# Patient Record
Sex: Female | Born: 1985
Health system: Southern US, Community
[De-identification: ages and names within clinical notes are randomized; demographics above are authoritative.]

## PROBLEM LIST (undated history)

## (undated) ENCOUNTER — Inpatient Hospital Stay: Payer: Self-pay

## (undated) DIAGNOSIS — Z8742 Personal history of other diseases of the female genital tract: Secondary | ICD-10-CM

## (undated) DIAGNOSIS — T7840XA Allergy, unspecified, initial encounter: Secondary | ICD-10-CM

## (undated) DIAGNOSIS — Z87442 Personal history of urinary calculi: Secondary | ICD-10-CM

## (undated) DIAGNOSIS — N946 Dysmenorrhea, unspecified: Secondary | ICD-10-CM

## (undated) DIAGNOSIS — K219 Gastro-esophageal reflux disease without esophagitis: Secondary | ICD-10-CM

## (undated) HISTORY — DX: Gastro-esophageal reflux disease without esophagitis: K21.9

## (undated) HISTORY — DX: Personal history of urinary calculi: Z87.442

## (undated) HISTORY — DX: Dysmenorrhea, unspecified: N94.6

## (undated) HISTORY — DX: Allergy, unspecified, initial encounter: T78.40XA

## (undated) HISTORY — DX: Personal history of other diseases of the female genital tract: Z87.42

## (undated) HISTORY — PX: NO PAST SURGERIES: SHX2092

---

## 2009-11-02 ENCOUNTER — Emergency Department: Payer: Self-pay | Admitting: Emergency Medicine

## 2011-07-11 ENCOUNTER — Ambulatory Visit: Payer: Self-pay | Admitting: General Practice

## 2012-07-17 ENCOUNTER — Ambulatory Visit: Payer: Self-pay | Admitting: General Practice

## 2013-11-19 ENCOUNTER — Encounter
Admit: 2013-11-19 | Disposition: A | Discharge status: Home / Self Care | Payer: Self-pay | Admitting: Obstetrics and Gynecology

## 2014-08-20 LAB — OB RESULTS CONSOLE ABO/RH: RH Type: POSITIVE

## 2014-08-20 LAB — OB RESULTS CONSOLE VARICELLA ZOSTER ANTIBODY, IGG: Varicella: IMMUNE

## 2014-08-20 LAB — OB RESULTS CONSOLE ANTIBODY SCREEN: ANTIBODY SCREEN: NEGATIVE

## 2014-08-20 LAB — OB RESULTS CONSOLE GC/CHLAMYDIA
CHLAMYDIA, DNA PROBE: NEGATIVE
GC PROBE AMP, GENITAL: NEGATIVE

## 2014-08-20 LAB — OB RESULTS CONSOLE HGB/HCT, BLOOD
HEMATOCRIT: 34 %
HEMOGLOBIN: 11.3 g/dL

## 2014-08-20 LAB — OB RESULTS CONSOLE RUBELLA ANTIBODY, IGM: Rubella: IMMUNE

## 2014-08-20 LAB — OB RESULTS CONSOLE HIV ANTIBODY (ROUTINE TESTING): HIV: NONREACTIVE

## 2014-08-20 LAB — OB RESULTS CONSOLE PLATELET COUNT: Platelets: 447 10*3/uL

## 2014-08-20 LAB — OB RESULTS CONSOLE RPR: RPR: NONREACTIVE

## 2014-08-20 LAB — OB RESULTS CONSOLE HEPATITIS B SURFACE ANTIGEN: Hepatitis B Surface Ag: NEGATIVE

## 2014-11-14 NOTE — L&D Delivery Note (Signed)
Delivery Note At 1:52 PM a viable female was delivered via Vaginal, Spontaneous Delivery (Presentation: cephalic; LOA ).  APGAR: 9, 9; weight 7 lb 5.8 oz (3340 g).   Placenta status: Intact, Spontaneous.  Cord: 3 vessels with the following complications: None.  Cord pH: not collected  Anesthesia: None  Episiotomy: None Lacerations:  1st degree perineal, hemostatic Suture Repair: none Est. Blood Loss (mL):  300 mL  Mom to postpartum.  Baby to Couplet care / Skin to Skin.  Samantha Blackwell 04/26/2015, 9:11 PM

## 2014-12-10 DIAGNOSIS — K219 Gastro-esophageal reflux disease without esophagitis: Secondary | ICD-10-CM | POA: Insufficient documentation

## 2014-12-10 DIAGNOSIS — R002 Palpitations: Secondary | ICD-10-CM | POA: Insufficient documentation

## 2015-04-16 ENCOUNTER — Ambulatory Visit (INDEPENDENT_AMBULATORY_CARE_PROVIDER_SITE_OTHER): Payer: 59 | Admitting: Obstetrics and Gynecology

## 2015-04-16 ENCOUNTER — Encounter: Payer: Self-pay | Admitting: Obstetrics and Gynecology

## 2015-04-16 ENCOUNTER — Telehealth: Payer: Self-pay | Admitting: Obstetrics and Gynecology

## 2015-04-16 VITALS — BP 108/71 | HR 82 | Ht 63.0 in | Wt 198.6 lb

## 2015-04-16 DIAGNOSIS — O1203 Gestational edema, third trimester: Secondary | ICD-10-CM

## 2015-04-16 DIAGNOSIS — Z3483 Encounter for supervision of other normal pregnancy, third trimester: Secondary | ICD-10-CM

## 2015-04-16 DIAGNOSIS — K5900 Constipation, unspecified: Secondary | ICD-10-CM | POA: Insufficient documentation

## 2015-04-16 DIAGNOSIS — O1200 Gestational edema, unspecified trimester: Secondary | ICD-10-CM | POA: Insufficient documentation

## 2015-04-16 DIAGNOSIS — Z3493 Encounter for supervision of normal pregnancy, unspecified, third trimester: Secondary | ICD-10-CM

## 2015-04-16 DIAGNOSIS — O99619 Diseases of the digestive system complicating pregnancy, unspecified trimester: Secondary | ICD-10-CM

## 2015-04-16 LAB — POCT URINALYSIS DIPSTICK
Bilirubin, UA: NEGATIVE
GLUCOSE UA: NEGATIVE
Ketones, UA: NEGATIVE
LEUKOCYTES UA: NEGATIVE
NITRITE UA: NEGATIVE
PH UA: 6
PROTEIN UA: NEGATIVE
Spec Grav, UA: <=1.005
Urobilinogen, UA: 0.2

## 2015-04-16 NOTE — Progress Notes (Signed)
ROB: Patient c/o possible LOF today.  Also notes having back contractions and pelvic cramping.  Exam with nitrizine test negative, no pooling noted on speculum exam.  Small amount of thin white discharge present, no odor.  Labor precautions given.  RTC in 1 week.  Can strip membranes if undelivered at that time. RTC in 1 week.

## 2015-04-16 NOTE — Telephone Encounter (Signed)
Called pt she states that this morning she noticed a "milky" like fluid leaking from her vagina. Pt states that this fluid ceased when she got in the shower. Pt denies regular contractions and vaginal bleeding. Advised pt to place a panty liner and monitor the amount of fluid throughout the rest of the morning. Advised pt to come in to the office if contractions became regular occuring every 5-10 minutes x 1 hour or if she observed any clear fluid coming from the vagina. Pt gave verbal understanding. Pt to be seen at previously scheduled appt at 3pm today.

## 2015-04-16 NOTE — Patient Instructions (Signed)
Labor and fetal movement precautions reviewed. RTC in 2 weeks.

## 2015-04-16 NOTE — Telephone Encounter (Signed)
Samantha Blackwell called and she has a milky discharge this morning. Pt stated she had an appt this afternoon @ 3 pm but thought she should call.

## 2015-04-24 ENCOUNTER — Ambulatory Visit (INDEPENDENT_AMBULATORY_CARE_PROVIDER_SITE_OTHER): Payer: 59 | Admitting: Obstetrics and Gynecology

## 2015-04-24 VITALS — BP 110/72 | HR 75 | Wt 199.9 lb

## 2015-04-24 DIAGNOSIS — Z3403 Encounter for supervision of normal first pregnancy, third trimester: Secondary | ICD-10-CM

## 2015-04-24 LAB — POCT URINALYSIS DIPSTICK
Bilirubin, UA: NEGATIVE
Blood, UA: NEGATIVE
Glucose, UA: NEGATIVE
KETONES UA: NEGATIVE
NITRITE UA: NEGATIVE
Protein, UA: NEGATIVE
SPEC GRAV UA: 1.025
Urobilinogen, UA: 0.2
pH, UA: 6.5

## 2015-04-24 NOTE — Patient Instructions (Signed)
Labor precautions reviewed.  Call office or follow up at Labor and Delivery for the following: 1) Contractions less than 10 min apart for longer than 1 hour 2) Rupture of membranes 3) Vaginal bleeding requiring a pad 4) Decreased fetal movement  RTC in 4 days for NST RTC in 1 week for routine OB visit.

## 2015-04-24 NOTE — Progress Notes (Signed)
ROB: Patient reports complaints of lower back aching. Occurs mostly at night, wakes her up out of her sleep.  Likely back labor.  Membrane stripping performed today.  Labor precautions given.  RTC next week for NST (for postdates) and ROB visit.

## 2015-04-25 ENCOUNTER — Encounter: Payer: Self-pay | Admitting: Advanced Practice Midwife

## 2015-04-25 ENCOUNTER — Observation Stay
Admission: EM | Admit: 2015-04-25 | Discharge: 2015-04-25 | Disposition: A | Discharge status: Home / Self Care | Payer: 59 | Source: Home / Self Care | Admitting: Obstetrics and Gynecology

## 2015-04-25 DIAGNOSIS — O26899 Other specified pregnancy related conditions, unspecified trimester: Secondary | ICD-10-CM

## 2015-04-25 DIAGNOSIS — Z79899 Other long term (current) drug therapy: Secondary | ICD-10-CM

## 2015-04-25 DIAGNOSIS — K219 Gastro-esophageal reflux disease without esophagitis: Secondary | ICD-10-CM | POA: Diagnosis present

## 2015-04-25 DIAGNOSIS — Z880 Allergy status to penicillin: Secondary | ICD-10-CM

## 2015-04-25 DIAGNOSIS — K5900 Constipation, unspecified: Secondary | ICD-10-CM

## 2015-04-25 DIAGNOSIS — R109 Unspecified abdominal pain: Secondary | ICD-10-CM

## 2015-04-25 DIAGNOSIS — O9962 Diseases of the digestive system complicating childbirth: Principal | ICD-10-CM | POA: Diagnosis present

## 2015-04-25 DIAGNOSIS — Z3A40 40 weeks gestation of pregnancy: Secondary | ICD-10-CM | POA: Diagnosis present

## 2015-04-25 DIAGNOSIS — O99619 Diseases of the digestive system complicating pregnancy, unspecified trimester: Principal | ICD-10-CM

## 2015-04-25 DIAGNOSIS — M7989 Other specified soft tissue disorders: Secondary | ICD-10-CM | POA: Diagnosis present

## 2015-04-25 NOTE — Discharge Summary (Signed)
Per MD order, pt discharged home, ambulatory in stable condition. Accompanied by FOB. Discharge instructions reviewed. Pt and FOB verbalized understanding of instructions.

## 2015-04-25 NOTE — OB Triage Note (Signed)
Pt arrived via wheelchair, Accompanied by FOB Minerva Areola

## 2015-04-26 ENCOUNTER — Encounter: Payer: Self-pay | Admitting: Anesthesiology

## 2015-04-26 ENCOUNTER — Observation Stay
Admission: EM | Admit: 2015-04-26 | Discharge: 2015-04-26 | Disposition: A | Discharge status: Home / Self Care | Payer: 59 | Source: Home / Self Care | Admitting: Obstetrics and Gynecology

## 2015-04-26 ENCOUNTER — Inpatient Hospital Stay
Admission: EM | Admit: 2015-04-26 | Discharge: 2015-04-28 | DRG: 775 | Disposition: A | Discharge status: Home / Self Care | Payer: 59 | Attending: Obstetrics and Gynecology | Admitting: Obstetrics and Gynecology

## 2015-04-26 DIAGNOSIS — Z3490 Encounter for supervision of normal pregnancy, unspecified, unspecified trimester: Secondary | ICD-10-CM

## 2015-04-26 DIAGNOSIS — Z880 Allergy status to penicillin: Secondary | ICD-10-CM | POA: Diagnosis not present

## 2015-04-26 DIAGNOSIS — Z79899 Other long term (current) drug therapy: Secondary | ICD-10-CM | POA: Diagnosis not present

## 2015-04-26 DIAGNOSIS — O9962 Diseases of the digestive system complicating childbirth: Secondary | ICD-10-CM | POA: Diagnosis present

## 2015-04-26 DIAGNOSIS — Z3A40 40 weeks gestation of pregnancy: Secondary | ICD-10-CM | POA: Diagnosis present

## 2015-04-26 DIAGNOSIS — M7989 Other specified soft tissue disorders: Secondary | ICD-10-CM | POA: Diagnosis present

## 2015-04-26 DIAGNOSIS — K5900 Constipation, unspecified: Secondary | ICD-10-CM | POA: Diagnosis present

## 2015-04-26 DIAGNOSIS — K219 Gastro-esophageal reflux disease without esophagitis: Secondary | ICD-10-CM | POA: Diagnosis present

## 2015-04-26 LAB — CBC
HEMATOCRIT: 35.8 % (ref 35.0–47.0)
Hemoglobin: 12 g/dL (ref 12.0–16.0)
MCH: 29.2 pg (ref 26.0–34.0)
MCHC: 33.5 g/dL (ref 32.0–36.0)
MCV: 87.2 fL (ref 80.0–100.0)
PLATELETS: 289 10*3/uL (ref 150–440)
RBC: 4.11 MIL/uL (ref 3.80–5.20)
RDW: 13.4 % (ref 11.5–14.5)
WBC: 19.2 10*3/uL — ABNORMAL HIGH (ref 3.6–11.0)

## 2015-04-26 LAB — TYPE AND SCREEN
ABO/RH(D): A POS
ANTIBODY SCREEN: NEGATIVE

## 2015-04-26 LAB — ABO/RH: ABO/RH(D): A POS

## 2015-04-26 MED ORDER — LACTATED RINGERS IV SOLN: INTRAVENOUS | Status: DC

## 2015-04-26 MED ORDER — HYDROCODONE-ACETAMINOPHEN 5-325 MG PO TABS
1.0000 | ORAL_TABLET | ORAL | Status: DC | PRN
Start: 2015-04-26 — End: 2015-04-28
  Administered 2015-04-26 – 2015-04-27 (×2): 1 via ORAL
  Filled 2015-04-26 (×3): qty 1

## 2015-04-26 MED ORDER — ZOLPIDEM TARTRATE 5 MG PO TABS: 5.0000 mg | ORAL_TABLET | Freq: Every evening | ORAL | Status: DC | PRN

## 2015-04-26 MED ORDER — BENZOCAINE-MENTHOL 20-0.5 % EX AERO
1.0000 "application " | INHALATION_SPRAY | CUTANEOUS | Status: DC | PRN
  Administered 2015-04-28: 1 via TOPICAL
  Filled 2015-04-26 (×2): qty 56

## 2015-04-26 MED ORDER — ONDANSETRON HCL 4 MG/2ML IJ SOLN: 4.0000 mg | INTRAMUSCULAR | Status: DC | PRN

## 2015-04-26 MED ORDER — DIBUCAINE 1 % RE OINT: 1.0000 "application " | TOPICAL_OINTMENT | RECTAL | Status: DC | PRN

## 2015-04-26 MED ORDER — ONDANSETRON HCL 4 MG PO TABS: 4.0000 mg | ORAL_TABLET | ORAL | Status: DC | PRN

## 2015-04-26 MED ORDER — HYDROCODONE-ACETAMINOPHEN 5-325 MG PO TABS
ORAL_TABLET | ORAL | Status: AC
  Administered 2015-04-26: 1
  Filled 2015-04-26: qty 1

## 2015-04-26 MED ORDER — OXYTOCIN 40 UNITS IN LACTATED RINGERS INFUSION - SIMPLE MED: 62.5000 mL/h | INTRAVENOUS | Status: DC

## 2015-04-26 MED ORDER — IBUPROFEN 600 MG PO TABS
600.0000 mg | ORAL_TABLET | Freq: Four times a day (QID) | ORAL | Status: DC
  Administered 2015-04-26 – 2015-04-28 (×7): 600 mg via ORAL
  Filled 2015-04-26 (×6): qty 1

## 2015-04-26 MED ORDER — BUTORPHANOL TARTRATE 1 MG/ML IJ SOLN
INTRAMUSCULAR | Status: AC
  Administered 2015-04-26: 1 mg via INTRAVENOUS
  Filled 2015-04-26: qty 1

## 2015-04-26 MED ORDER — FENTANYL 2.5 MCG/ML W/ROPIVACAINE 0.2% IN NS 100 ML EPIDURAL INFUSION (ARMC-ANES)
EPIDURAL | Status: AC
  Filled 2015-04-26: qty 100

## 2015-04-26 MED ORDER — LANOLIN HYDROUS EX OINT
TOPICAL_OINTMENT | CUTANEOUS | Status: DC | PRN
Start: 2015-04-26 — End: 2015-04-28

## 2015-04-26 MED ORDER — SENNOSIDES-DOCUSATE SODIUM 8.6-50 MG PO TABS
2.0000 | ORAL_TABLET | ORAL | Status: DC
  Administered 2015-04-27: 2 via ORAL
  Filled 2015-04-26: qty 2

## 2015-04-26 MED ORDER — DIPHENHYDRAMINE HCL 25 MG PO CAPS: 25.0000 mg | ORAL_CAPSULE | Freq: Four times a day (QID) | ORAL | Status: DC | PRN

## 2015-04-26 MED ORDER — OXYTOCIN 40 UNITS IN LACTATED RINGERS INFUSION - SIMPLE MED
INTRAVENOUS | Status: AC
  Filled 2015-04-26: qty 1000

## 2015-04-26 MED ORDER — IBUPROFEN 600 MG PO TABS
ORAL_TABLET | ORAL | Status: AC
  Administered 2015-04-26: 600 mg via ORAL
  Filled 2015-04-26: qty 1

## 2015-04-26 MED ORDER — OXYTOCIN 40 UNITS IN LACTATED RINGERS INFUSION - SIMPLE MED: 62.5000 mL/h | INTRAVENOUS | Status: DC | PRN

## 2015-04-26 MED ORDER — LACTATED RINGERS IV SOLN: 500.0000 mL | INTRAVENOUS | Status: DC | PRN

## 2015-04-26 MED ORDER — ACETAMINOPHEN 325 MG PO TABS: 650.0000 mg | ORAL_TABLET | ORAL | Status: DC | PRN

## 2015-04-26 MED ORDER — BUTORPHANOL TARTRATE 1 MG/ML IJ SOLN
1.0000 mg | Freq: Once | INTRAMUSCULAR | Status: AC
  Administered 2015-04-26: 1 mg via INTRAVENOUS

## 2015-04-26 MED ORDER — WITCH HAZEL-GLYCERIN EX PADS: 1.0000 "application " | MEDICATED_PAD | CUTANEOUS | Status: DC | PRN

## 2015-04-26 MED ORDER — OXYTOCIN BOLUS FROM INFUSION: 500.0000 mL | INTRAVENOUS | Status: DC

## 2015-04-26 MED ORDER — PRENATAL MULTIVITAMIN CH
1.0000 | ORAL_TABLET | Freq: Every day | ORAL | Status: DC
  Filled 2015-04-26 (×2): qty 1

## 2015-04-26 NOTE — Anesthesia Postprocedure Evaluation (Signed)
  Anesthesia Post-op Note  Patient: Samantha Blackwell  Procedure(s) Performed: * No procedures listed *  Anesthesia type:No value filed.  Patient location: PACU  Post pain: Pain level controlled  Post assessment: Post-op Vital signs reviewed, Patient's Cardiovascular Status Stable, Respiratory Function Stable, Patent Airway and No signs of Nausea or vomiting  Post vital signs: Reviewed and stable  Last Vitals:  Filed Vitals:   04/26/15 1315  BP: 124/79  Pulse: 88    Level of consciousness: awake, alert  and patient cooperative  Complications: No epidural done - pt started pushing.

## 2015-04-26 NOTE — Lactation Note (Signed)
This note was copied from the chart of Samantha Blackwell. Mom unable to latch [place] on her own.  Demonstrated and incorporated FOB assistance to latch [name] to left breast in cross cradle hold swaddled using #20 nipple shield.  Moved up to #24 NS on right breast in football hold.  [name] could sustain latch for long interval with good rhythmic sucking.  Still need to continue to work on depth.  Mom felt strong tugs and cramping during feeding with NS.  Nipple shield was full of colostrum on both sides after feeding.  DEBP Symphony set up in room, but did not need to use with this feeding since good nutritive sucking was noted.

## 2015-04-26 NOTE — H&P (Signed)
Obstetric History and Physical  Samantha Blackwell is a 29 y.o. G1P0 with IUP at [redacted]w[redacted]d, Estimated Date of Delivery: 04/22/15 by Patient's last menstrual period was 07/16/2014.  presenting for ruptured membranes and active labor. Patient states she has been having  regular, every 5-7 minutes contractions, no vaginal bleeding, ruptured membranes (at ~ 12:30 a.m.), with active fetal movement.  Patient has been seen twice in triage over the past 12 hours for worsening contractions, and questionable PROM.  Was deemed intact and in early labor both times, and was discharged home.   Prenatal Course Source of Care: [facility] with onset of care at 11 weeks Pregnancy complications or risks: Patient Active Problem List   Diagnosis Date Noted  . Pregnancy 04/26/2015  . Indication for care in labor and delivery, antepartum 04/25/2015  . Leg swelling in pregnancy 04/16/2015  . Constipation in pregnancy 04/16/2015  . Acid reflux 12/10/2014  . Awareness of heartbeats 12/10/2014   She plans to breastfeed She desires oral contraceptives (estrogen/progesterone) for postpartum contraception.   Prenatal labs and studies: ABO, Rh: A/Positive/-- (10/07 2015) Antibody: Negative (10/07 2015) Rubella: Immune (10/07 2015) RPR: Nonreactive (10/07 2015)  HBsAg: Negative (10/07 2015)  HIV: Non-reactive (10/07 2015)  GBS: negative 1 hr Glucola  wnl Genetic screening normal Anatomy US normal   Past Medical History  Diagnosis Date  . GERD (gastroesophageal reflux disease)   . Dysmenorrhea     No past surgical history on file.  OB History  Gravida Para Term Preterm AB SAB TAB Ectopic Multiple Living  1             # Outcome Date GA Lbr Len/2nd Weight Sex Delivery Anes PTL Lv  1 Current               History   Social History  . Marital Status: Single    Spouse Name: N/A  . Number of Children: N/A  . Years of Education: N/A   Social History Main Topics  . Smoking status: Never Smoker    . Smokeless tobacco: Never Used  . Alcohol Use: No  . Drug Use: No  . Sexual Activity: Yes    Birth Control/ Protection: None     Comment: Pregnant   Other Topics Concern  . Not on file   Social History Narrative    No family history on file.  Prescriptions prior to admission  Medication Sig Dispense Refill Last Dose  . lansoprazole (PREVACID) 15 MG capsule Take by mouth.   Taking  . Prenatal Vit-Fe Fumarate-FA (MULTIVITAMIN-PRENATAL) 27-0.8 MG TABS tablet Take 1 tablet by mouth daily at 12 noon.   Taking    Allergies  Allergen Reactions  . Penicillin G Rash    Review of Systems: Negative except for what is mentioned in HPI.  Physical Exam: LMP 07/16/2014 CONSTITUTIONAL: Well-developed, well-nourished female in no acute distress.  HENT:  Normocephalic, atraumatic, External right and left ear normal. Oropharynx is clear and moist EYES: Conjunctivae and EOM are normal. Pupils are equal, round, and reactive to light. No scleral icterus.  NECK: Normal range of motion, supple, no masses SKIN: Skin is warm and dry. No rash noted. Not diaphoretic. No erythema. No pallor. NEUROLGIC: Alert and oriented to person, place, and time. Normal reflexes, muscle tone coordination. No cranial nerve deficit noted. PSYCHIATRIC: Normal mood and affect. Normal behavior. Normal judgment and thought content. CARDIOVASCULAR: Normal heart rate noted, regular rhythm RESPIRATORY: Effort and breath sounds normal, no problems with respiration  noted ABDOMEN: Soft, nontender, nondistended, gravid. MUSCULOSKELETAL: Normal range of motion. No edema and no tenderness. 2+ distal pulses.  Cervical Exam: Dilatation 6-7cm   Effacement 100%   Station -2   Presentation: cephalic FHT:  Baseline rate 130 bpm   Variability moderate  Accelerations present   Decelerations 1 late (@ 1220, down to 90 bpm, lasing 1.5 min, with good recovery).    Contractions: Every 3-5 mins   Pertinent Labs/Studies:   Results for  orders placed or performed during the hospital encounter of 04/26/15 (from the past 24 hour(s))  CBC     Status: Abnormal   Collection Time: 04/26/15 12:25 PM  Result Value Ref Range   WBC 19.2 (H) 3.6 - 11.0 K/uL   RBC 4.11 3.80 - 5.20 MIL/uL   Hemoglobin 12.0 12.0 - 16.0 g/dL   HCT 40.3 47.4 - 25.9 %   MCV 87.2 80.0 - 100.0 fL   MCH 29.2 26.0 - 34.0 pg   MCHC 33.5 32.0 - 36.0 g/dL   RDW 56.3 87.5 - 64.3 %   Platelets 289 150 - 440 K/uL    Assessment : Samantha Blackwell is a 29 y.o. G1P0 at [redacted]w[redacted]d being admitted for labor.  Plan: Labor: Expectant management.  Augmentation as needed, per protocol FWB: Reassuring fetal heart tracing.  GBS negative Epidural desired.  Will administer.  Stadol for IV analgesia until epidural received.  Delivery plan: Hopeful for vaginal delivery  Hildred Laser, MD [facility] 04/26/2015 12:53 PM

## 2015-04-26 NOTE — Anesthesia Preprocedure Evaluation (Addendum)
Anesthesia Evaluation  Patient identified by MRN, date of birth, ID band Patient awake    Reviewed: Allergy & Precautions, NPO status , Patient's Chart, lab work & pertinent test results  Airway Mallampati: II  TM Distance: >3 FB     Dental  (+) Chipped   Pulmonary          Cardiovascular     Neuro/Psych    GI/Hepatic GERD-  ,  Endo/Other    Renal/GU      Musculoskeletal   Abdominal   Peds  Hematology   Anesthesia Other Findings No epidural done - pt started pushing.  Reproductive/Obstetrics                            Anesthesia Physical Anesthesia Plan  ASA: II  Anesthesia Plan: Epidural   Post-op Pain Management:    Induction:   Airway Management Planned:   Additional Equipment:   Intra-op Plan:   Post-operative Plan:   Informed Consent: I have reviewed the patients History and Physical, chart, labs and discussed the procedure including the risks, benefits and alternatives for the proposed anesthesia with the patient or authorized representative who has indicated his/her understanding and acceptance.     Plan Discussed with:   Anesthesia Plan Comments:         Anesthesia Quick Evaluation

## 2015-04-26 NOTE — Discharge Summary (Signed)
Per MD order, pt discharged home, ambulatory in stable condition. Accompanied by FOB. Discharge instructions reviewed. Pt and FOB verbalized understanding of instructions. 

## 2015-04-26 NOTE — Lactation Note (Signed)
This note was copied from the chart of Samantha Blackwell. Mom has semi flat nipples at rest, but can easily compress to evert with stimulation.  Demonstrated how to easily hand express colostrum.  Assisted mom to get in comfortable position with pillow support with [name] skin to skin in biological cradle hold on left breast.  [name] has tight frenulum.  He has difficulty opening wide and lowering and flanging his lower lip.  After several attempts, we were able to get him to open wide enough holding the breast in tea cup hold for a few good sucks before slipping back to a shallow latch.  When he would go back to shallow latching, dimpling was noted.  When he was on and sucking agressively, mom reports strong sucking with uterine cramping.  Breast massaged to get as much colostrum in him as possible while he was sucking nutritively and an occassional swallow was noted.  He was on and off the breast several times having difficulty sustaining the deep latch in the 15 to 20 minutes he was on the left breast.  We attempted the right breast in football hold skin to skin, we experienced the same scenario of on and off the breast resorting to shallow latch again.

## 2015-04-27 LAB — CBC
HCT: 29.6 % — ABNORMAL LOW (ref 35.0–47.0)
HEMOGLOBIN: 10.1 g/dL — AB (ref 12.0–16.0)
MCH: 30.1 pg (ref 26.0–34.0)
MCHC: 34.1 g/dL (ref 32.0–36.0)
MCV: 88.3 fL (ref 80.0–100.0)
PLATELETS: 244 10*3/uL (ref 150–440)
RBC: 3.36 MIL/uL — AB (ref 3.80–5.20)
RDW: 13.3 % (ref 11.5–14.5)
WBC: 16.8 10*3/uL — ABNORMAL HIGH (ref 3.6–11.0)

## 2015-04-27 LAB — RPR: RPR Ser Ql: NONREACTIVE

## 2015-04-27 MED ORDER — IBUPROFEN 800 MG PO TABS: 800.0000 mg | ORAL_TABLET | Freq: Three times a day (TID) | ORAL | Status: DC | PRN

## 2015-04-27 MED ORDER — LANOLIN HYDROUS EX OINT: 1.0000 "application " | TOPICAL_OINTMENT | CUTANEOUS | Status: DC | PRN

## 2015-04-27 NOTE — Progress Notes (Addendum)
Post Partum Day 1 Subjective: no complaints, up ad lib, voiding and tolerating PO  Objective: Temp:  [97.9 F (36.6 C)-98.3 F (36.8 C)] 98 F (36.7 C) (06/13 0812) Pulse Rate:  [72-89] 80 (06/13 0812) Resp:  [18-20] 20 (06/13 0812) BP: (93-124)/(52-80) 99/53 mmHg (06/13 0812) SpO2:  [99 %-100 %] 100 % (06/13 0400)  Physical Exam:  General: alert and no distress Lochia: appropriate Uterine Fundus: firm Incision: none DVT Evaluation: No cords or calf tenderness.  No significant calf/ankle edema.  CBC Latest Ref Rng 04/27/2015 04/26/2015  WBC 3.6 - 11.0 K/uL 16.8(H) 19.2(H)  Hemoglobin 12.0 - 16.0 g/dL 10.1(L) 12.0  Hematocrit 35.0 - 47.0 % 29.6(L) 35.8  Platelets 150 - 440 K/uL 244 289     Assessment/Plan: Plan for discharge tomorrow, Lactation consult, Circumcision prior to discharge and Contraception OCPs   LOS: 1 day   Hildred Laser 04/27/2015, 9:16 AM

## 2015-04-27 NOTE — Lactation Note (Signed)
This note was copied from the chart of Boy Marceille Manly. Lactation Consultation Note Baby has not fed since early am,, very sleepy, attempted latch with mom in left side lying position with 24 mm nipple shield, baby will latch after few tries , suck a few times, one swallow heard and stop, unable to sustain latch and suck, has tight jaw and painful for mother, feels better for mom when baby's lower jaw gently pressed down on, will suck on gloved finger with stimulation, sucks at front of mouth, baby jittery, mom pumped breasts x 15 min and drops colostrum obtained and given to baby by finger feed and then baby finger fed 5cc similac by curved tip syringe, stimulation needed to get baby to suck and swallow. Patient Name: Boy Scotlynn Sorg [id number] Date: 04/27/2015 Reason for consult: Initial assessment;Follow-up assessment;Breast/nipple pain;Difficult latch   Maternal Data    Feeding Feeding Type: Formula (sim 19 cal) Length of feed: 1 min  LATCH Score/Interventions Latch: Repeated attempts needed to sustain latch, nipple held in mouth throughout feeding, stimulation needed to elicit sucking reflex. Intervention(s): Assist with latch  Audible Swallowing: A few with stimulation  Type of Nipple: Everted at rest and after stimulation  Comfort (Breast/Nipple): Filling, red/small blisters or bruises, mild/mod discomfort  Problem noted: Mild/Moderate discomfort Interventions (Mild/moderate discomfort): Comfort gels  Hold (Positioning): Full assist, staff holds infant at breast Intervention(s): Position options  LATCH Score: 5  Lactation Tools Discussed/Used Tools: Nipple Shields;Pump;Comfort gels Nipple shield size: 24 Breast pump type: Double-Electric Breast Pump WIC Program: No Pump Review: Setup, frequency, and cleaning   Consult Status      Dyann Kief 04/27/2015, 11:57 AM

## 2015-04-28 ENCOUNTER — Other Ambulatory Visit: Payer: 59

## 2015-04-28 MED ORDER — IBUPROFEN 600 MG PO TABS
600.0000 mg | ORAL_TABLET | Freq: Four times a day (QID) | ORAL | Status: DC
Start: 2015-04-28 — End: 2015-04-28
  Administered 2015-04-28: 600 mg via ORAL
  Filled 2015-04-28: qty 1

## 2015-04-28 NOTE — Progress Notes (Signed)
Discharge instructions provided.  Pt and sig other verbalize understanding of all instructions and follow-up care.  Pt discharged to home with infant at 1444 on 04/28/15 via wheelchair by volunteer. Reynold Bowen, RN 04/28/2015 2:46 PM

## 2015-04-28 NOTE — Discharge Instructions (Signed)
General Postpartum Discharge Instructions ° °Do not drink alcohol or take tranquilizers.  °Do not take medicine that has not been prescribed by your doctor.  °Take showers instead of baths until your doctor gives you permission to take baths.  °No sexual intercourse or placement of anything in the vagina for 6 weeks or as instructed by your doctor. °Only take prescription or over-the-counter medicines  for pain, discomfort, or fever as directed by your doctor. Take medicines (antibiotics) that kill germs if they are prescribed for you. °  °Call the office or go to the Emergency Room if:  °You feel sick to your stomach (nauseous).  °You start to throw up (vomit).  °You have trouble eating or drinking.  °You have an oral temperature above 101.  °You have constipation that is not helped by adjusting diet or increasing fluid intake. Pain medicines are a common cause of constipation.  °You have foul smelling vaginal discharge or odor.  °You have bleeding requiring changing more than 1 pad per hour. °You have any other concerns. ° °SEEK IMMEDIATE MEDICAL CARE IF:  °You have persistent dizziness.  °You have difficulty breathing or shortness of breath.  °You have an oral temperature above 102.5, not controlled by medicine.  ° ° °Call your doctor for increased pain or vaginal bleeding, temperature above 100.4, depression, or concerns.  No strenuous activity or heavy lifting for 6 weeks.  No intercourse, tampons, douching, or enemas for 6 weeks.  No tub baths-showers only.  No driving for 2 weeks or while taking pain medications.  Continue prenatal vitamin and iron.   °

## 2015-04-28 NOTE — Progress Notes (Signed)
Post Partum Day 2 Subjective: no complaints, up ad lib, voiding and tolerating PO  Objective: Blood pressure 104/70, pulse 101, temperature 98.5 F (36.9 C), temperature source Oral, resp. rate 18, last menstrual period 07/16/2014, SpO2 100 %, unknown if currently breastfeeding.  Physical Exam:  General: alert and no distress Lochia: appropriate Uterine Fundus: firm Incision: none DVT Evaluation: No cords or calf tenderness.  No significant calf/ankle edema.  CBC Latest Ref Rng 04/27/2015 04/26/2015  WBC 3.6 - 11.0 K/uL 16.8(H) 19.2(H)  Hemoglobin 12.0 - 16.0 g/dL 10.1(L) 12.0  Hematocrit 35.0 - 47.0 % 29.6(L) 35.8  Platelets 150 - 440 K/uL 244 289     Assessment/Plan: Discharge home, Breastfeeding and Contraception OCPs  RTC in 6 weeks for postpartum visit.    LOS: 2 days   Hildred Laser 04/28/2015, 7:53 AM

## 2015-04-28 NOTE — Progress Notes (Signed)
Prenatal records indicate that pt received TDaP vaccine on 01/27/15. Reynold Bowen, RN 04/28/2015 12:17 PM

## 2015-04-28 NOTE — Discharge Summary (Signed)
Obstetric Discharge Summary Reason for Admission: onset of labor Prenatal Procedures: none Intrapartum Procedures: spontaneous vaginal delivery Postpartum Procedures: none Complications-Operative and Postpartum: 1st degree (hemostatic) degree perineal laceration  Physical Exam:  General: alert, cooperative and no distress Lochia: appropriate Uterine Fundus: firm Incision: none DVT Evaluation: No evidence of DVT seen on physical exam.  Discharge Diagnoses: Term Pregnancy-delivered  Discharge Information:  Admission Date: 04/26/2015 Discharge Date: 04/28/2015 Activity: unrestricted Diet: routine Medications: PNV and Ibuprofen Condition: stable Instructions: refer to practice specific booklet Discharge to: home Follow-up Information    Follow up with Hildred Laser, MD. Schedule an appointment as soon as possible for a visit in 6 weeks.   Specialties:  Obstetrics and Gynecology, Radiology   Why:  For postpartum check   Contact information:   [address] [place] [address] [phone]       Newborn Data: Live born female  Birth Weight: 7 lb 5.8 oz (3340 g) APGAR: 9, 9  Home with mother.  Hildred Laser 04/28/2015, 7:53 AM

## 2015-04-30 ENCOUNTER — Encounter: Payer: 59 | Admitting: Obstetrics and Gynecology

## 2015-05-15 DIAGNOSIS — Z87442 Personal history of urinary calculi: Secondary | ICD-10-CM

## 2015-05-15 HISTORY — DX: Personal history of urinary calculi: Z87.442

## 2015-05-19 ENCOUNTER — Telehealth: Payer: Self-pay | Admitting: Obstetrics and Gynecology

## 2015-05-19 NOTE — Telephone Encounter (Signed)
Pt states she had to stop nursing d/t baby having an allergy. He is on soy formula. Peds gave her a diflucan to take for y/i in breast. NO fevers. Advised to wrap breast in ace bandage. Cold compress and ibup as needed. Avoid stimulation to breast. Contact office if any fevers, malaise or flu like sx. Pt voices understanding.

## 2015-05-19 NOTE — Telephone Encounter (Signed)
Pt called and she wanted to know what she needs to do fro drying her breast milk up..she has been doing cabbage leaves and she has yeast infection in both of them, she would like a call back

## 2015-05-22 ENCOUNTER — Other Ambulatory Visit: Payer: Self-pay | Admitting: Physician Assistant

## 2015-05-22 ENCOUNTER — Ambulatory Visit
Admission: RE | Admit: 2015-05-22 | Discharge: 2015-05-22 | Disposition: A | Discharge status: Home / Self Care | Payer: 59 | Source: Ambulatory Visit | Attending: Physician Assistant | Admitting: Physician Assistant

## 2015-05-22 DIAGNOSIS — R109 Unspecified abdominal pain: Secondary | ICD-10-CM | POA: Diagnosis present

## 2015-05-22 DIAGNOSIS — K429 Umbilical hernia without obstruction or gangrene: Secondary | ICD-10-CM | POA: Insufficient documentation

## 2015-05-22 DIAGNOSIS — N200 Calculus of kidney: Secondary | ICD-10-CM | POA: Insufficient documentation

## 2015-05-22 DIAGNOSIS — R319 Hematuria, unspecified: Secondary | ICD-10-CM

## 2015-05-22 DIAGNOSIS — R10A1 Flank pain, right side: Secondary | ICD-10-CM

## 2015-06-02 ENCOUNTER — Ambulatory Visit: Payer: 59 | Admitting: Obstetrics and Gynecology

## 2015-06-11 ENCOUNTER — Encounter: Payer: Self-pay | Admitting: Obstetrics and Gynecology

## 2015-06-11 ENCOUNTER — Ambulatory Visit (INDEPENDENT_AMBULATORY_CARE_PROVIDER_SITE_OTHER): Payer: 59 | Admitting: Obstetrics and Gynecology

## 2015-06-11 VITALS — BP 118/76 | HR 64 | Ht 63.5 in | Wt 178.6 lb

## 2015-06-11 DIAGNOSIS — N200 Calculus of kidney: Secondary | ICD-10-CM

## 2015-06-11 NOTE — Progress Notes (Addendum)
Subjective:     Samantha Blackwell is a 29 y.o. G48P1001 female who presents for a postpartum visit. She is 6 weeks postpartum following a spontaneous vaginal delivery. I have fully reviewed the prenatal and intrapartum course. The delivery was at 40 gestational weeks. Outcome: spontaneous vaginal delivery. Anesthesia: epidural. Postpartum course has been complicated by renal stones (patient reports passing 2 stones ~ 2-3 weeks ago, notes that a scan ordered by [employer] noted several more) . Baby's course has been ok (infant with severe reflux, possible colic). Baby is feeding by bottle. Bleeding no bleeding. Bowel function is normal. Bladder function is normal. Patient is not sexually active. Contraception method is OCP (estrogen/progesterone). Postpartum depression screening: negative.  Menses possibly has resumed (approximately 2-3 weeks ago).   The following portions of the patient's history were reviewed and updated as appropriate: allergies, current medications, past family history, past medical history, past social history, past surgical history and problem list.  Review of Systems A comprehensive review of systems was negative except for: Genitourinary: positive for renal stones and daily light vaginal bleeding   Objective:    BP 118/76 mmHg  Pulse 64  Ht 5' 3.5" (1.613 m)  Wt 178 lb 9.6 oz (81.012 kg)  BMI 31.14 kg/m2  Breastfeeding? No   General:  alert and no distress   Breasts:  inspection negative, no nipple discharge or bleeding, no masses or nodularity palpable  Lungs: clear to auscultation bilaterally  Heart:  regular rate and rhythm, S1, S2 normal, no murmur, click, rub or gallop  Abdomen: soft, non-tender; bowel sounds normal; no masses,  no organomegaly   Vulva:  normal  Vagina: normal vagina, no discharge, exudate, lesion, or erythema  Cervix:  nulliparous appearance  Corpus: normal size, contour, position, consistency, mobility, non-tender  Adnexa:  normal adnexa  and no mass, fullness, tenderness  Rectal Exam: Not performed.          Lab Results  Component Value Date   HGB 10.1* 04/27/2015    Assessment:   Normal postpartum exam. Pap smear not done at today's visit.   Recent h/o newly diagnosed renal stones  Plan:   1. Contraception: OCP (estrogen/progesterone).  Given sample of Minastrin, will call in prescription.  2. Will order referral to Urology for recent h/o renal stones.  3. Follow up in: 3 months for annual exam or as needed.     Hildred Laser, MD [facility]

## 2015-06-14 MED ORDER — NORETHIN ACE-ETH ESTRAD-FE 1-20 MG-MCG(24) PO CHEW
1.0000 | CHEWABLE_TABLET | Freq: Every day | ORAL | Status: DC
Start: 2015-06-14 — End: 2016-02-15

## 2015-07-14 ENCOUNTER — Ambulatory Visit (INDEPENDENT_AMBULATORY_CARE_PROVIDER_SITE_OTHER): Payer: 59 | Admitting: Obstetrics and Gynecology

## 2015-07-14 ENCOUNTER — Encounter: Payer: Self-pay | Admitting: Obstetrics and Gynecology

## 2015-07-14 VITALS — BP 101/75 | HR 74 | Ht 63.5 in | Wt 180.3 lb

## 2015-07-14 DIAGNOSIS — R5383 Other fatigue: Secondary | ICD-10-CM | POA: Diagnosis not present

## 2015-07-14 MED ORDER — CYANOCOBALAMIN 1000 MCG/ML IJ SOLN: 1000.0000 ug | Freq: Once | INTRAMUSCULAR | Status: DC

## 2015-07-14 MED ORDER — PHENTERMINE HCL 37.5 MG PO TABS: 37.5000 mg | ORAL_TABLET | Freq: Every day | ORAL | Status: DC

## 2015-07-14 NOTE — Progress Notes (Signed)
Patient ID: Samantha Blackwell, female   DOB: 1986/10/17, 29 y.o.   MRN: [id number]  Subjective:  Samantha Blackwell is a 29 y.o. G1P1001 at Unknown being seen today for weight loss management- initial visit.  Patient reports General ROS: positive for  - fatigue and sleep disturbance and reports previous weight loss attempts: Management changes made at the last visit include:  adding medication-OCPs, stopping medication-PNV,  Onset followed:   recent pregnancy,  Associated symptoms include: fatigue,  change in clothing fit and menstrual changes. Previous/Current treatment includes: small frequent feedings, nutritional supplement, vitamin supplement, Past treatment has included: small frequent feedings, nutritional supplement, vitamin supplement,  exercise management.  The following portions of the patient's history were reviewed and updated as appropriate: allergies, current medications, past family history, past medical history, past social history, past surgical history and problem list.   Objective:   Filed Vitals:   07/14/15 0828  BP: 101/75  Pulse: 74  Height: 5' 3.5" (1.613 m)  Weight: 180 lb 4.8 oz (81.784 kg)    General:  Alert, oriented and cooperative. Patient is in no acute distress.  :   :   :   :   :   :   PE: Well groomed female in no current distress,   Mental Status: Normal mood and affect. Normal behavior. Normal judgment and thought content.   Current BMI: Body mass index is 31.43 kg/(m^2).   Assessment and Plan:  Obesity  1. Other fatigue H/O PP anemia - CBC - Vitamin D (25 hydroxy) - B12 - Ferritin - Comprehensive metabolic panel   Plan: low carb, High protein diet RX for adipex 37.5 mg daily and B12 .ml monthly, to start now with first injection given at today's visit. Reviewed side-effects common to both medications and expected outcomes. Increase daily water intake to at least 8 bottle a day, every day.  Goal is to reduse weight by 10% by end  of three months, and will re-evaluate then.  RTC in 4 weeks for Nurse visit to check weight & BP, and get next B12 injections.    Please refer to After Visit Summary for other counseling recommendations.    Alejah Aristizabal Elissa Lovett, CNM   Ailyne Pawley Elissa Lovett, CNM      Consider the Low Glycemic Index Diet and 6 smaller meals daily .  This boosts your metabolism and regulates your sugars:   Use the protein bar by [employer]kins because they have lots of fiber in them  Find the low carb flatbreads, tortillas and pita breads for sandwiches:  Joseph's makes a pita bread and a flat bread , available at [facility] and [employer]; Toufayah makes a low carb flatbread available at [employer] and HT that is 9 net carbs and 100 cal [place] makes a low carb whole wheat tortilla available at [employer] most grocery stores with 6 net carbs and 210 cal  [place] yogurt can still have a lot of carbs .  Dannon Light N fit has 80 cal and 8 carbs

## 2015-07-14 NOTE — Patient Instructions (Signed)
Exercise to Lose Weight Exercise and a healthy diet may help you lose weight. Your doctor may suggest specific exercises. EXERCISE IDEAS AND TIPS  Choose low-cost things you enjoy doing, such as walking, bicycling, or exercising to workout videos.  Take stairs instead of the elevator.  Walk during your lunch break.  Park your car further away from work or school.  Go to a gym or an exercise class.  Start with 5 to 10 minutes of exercise each day. Build up to 30 minutes of exercise 4 to 6 days a week.  Wear shoes with good support and comfortable clothes.  Stretch before and after working out.  Work out until you breathe harder and your heart beats faster.  Drink extra water when you exercise.  Do not do so much that you hurt yourself, feel dizzy, or get very short of breath. Exercises that burn about 150 calories:  Running 1  miles in 15 minutes.  Playing volleyball for 45 to 60 minutes.  Washing and waxing a car for 45 to 60 minutes.  Playing touch football for 45 minutes.  Walking 1  miles in 35 minutes.  Pushing a stroller 1  miles in 30 minutes.  Playing basketball for 30 minutes.  Raking leaves for 30 minutes.  Bicycling 5 miles in 30 minutes.  Walking 2 miles in 30 minutes.  Dancing for 30 minutes.  Shoveling snow for 15 minutes.  Swimming laps for 20 minutes.  Walking up stairs for 15 minutes.  Bicycling 4 miles in 15 minutes.  Gardening for 30 to 45 minutes.  Jumping rope for 15 minutes.  Washing windows or floors for 45 to 60 minutes. Document Released: 12/03/2010 Document Revised: 01/23/2012 Document Reviewed: 12/03/2010 Exit[employer] Patient Information 2015 [employer]. This information is not intended to replace advice given to you by your health care provider. Make sure you discuss any questions you have with your health care provider.  

## 2015-07-15 LAB — FERRITIN: Ferritin: 18 ng/mL (ref 15–150)

## 2015-07-15 LAB — COMPREHENSIVE METABOLIC PANEL
ALBUMIN: 4.1 g/dL (ref 3.5–5.5)
ALT: 13 IU/L (ref 0–32)
AST: 14 IU/L (ref 0–40)
Albumin/Globulin Ratio: 1.3 (ref 1.1–2.5)
Alkaline Phosphatase: 91 IU/L (ref 39–117)
BUN / CREAT RATIO: 15 (ref 8–20)
BUN: 12 mg/dL (ref 6–20)
Bilirubin Total: 0.3 mg/dL (ref 0.0–1.2)
CALCIUM: 9 mg/dL (ref 8.7–10.2)
CO2: 22 mmol/L (ref 18–29)
CREATININE: 0.81 mg/dL (ref 0.57–1.00)
Chloride: 101 mmol/L (ref 97–108)
GFR calc Af Amer: 115 mL/min/{1.73_m2} (ref >59)
GFR, EST NON AFRICAN AMERICAN: 100 mL/min/{1.73_m2} (ref >59)
GLOBULIN, TOTAL: 3.1 g/dL (ref 1.5–4.5)
Glucose: 56 mg/dL — ABNORMAL LOW (ref 65–99)
POTASSIUM: 4.3 mmol/L (ref 3.5–5.2)
SODIUM: 138 mmol/L (ref 134–144)
Total Protein: 7.2 g/dL (ref 6.0–8.5)

## 2015-07-15 LAB — CBC
Hematocrit: 37.4 % (ref 34.0–46.6)
Hemoglobin: 12.4 g/dL (ref 11.1–15.9)
MCH: 28.7 pg (ref 26.6–33.0)
MCHC: 33.2 g/dL (ref 31.5–35.7)
MCV: 87 fL (ref 79–97)
Platelets: 418 10*3/uL — ABNORMAL HIGH (ref 150–379)
RBC: 4.32 x10E6/uL (ref 3.77–5.28)
RDW: 13.6 % (ref 12.3–15.4)
WBC: 5.8 10*3/uL (ref 3.4–10.8)

## 2015-07-15 LAB — VITAMIN D 25 HYDROXY (VIT D DEFICIENCY, FRACTURES): VIT D 25 HYDROXY: 28.5 ng/mL — AB (ref 30.0–100.0)

## 2015-07-15 LAB — VITAMIN B12: Vitamin B-12: 412 pg/mL (ref 211–946)

## 2015-07-16 ENCOUNTER — Other Ambulatory Visit: Payer: Self-pay | Admitting: Obstetrics and Gynecology

## 2015-07-16 ENCOUNTER — Telehealth: Payer: Self-pay | Admitting: *Deleted

## 2015-07-16 DIAGNOSIS — E559 Vitamin D deficiency, unspecified: Secondary | ICD-10-CM | POA: Insufficient documentation

## 2015-07-16 MED ORDER — VITAMIN D (ERGOCALCIFEROL) 1.25 MG (50000 UNIT) PO CAPS: 50000.0000 [IU] | ORAL_CAPSULE | ORAL | Status: DC

## 2015-07-16 NOTE — Telephone Encounter (Signed)
Notified pt of lab results 

## 2015-07-16 NOTE — Telephone Encounter (Signed)
-----   Message from Ulyses Amor, [place] sent at 07/16/2015  3:59 PM EDT ----- Please let her know labs were normal except vit d is too low- i sent in rx for weekly supplement to pharmacy, also glucose was too low, make sure she is eating enough protein to keep it stable

## 2015-07-21 ENCOUNTER — Ambulatory Visit: Payer: 59

## 2015-08-04 ENCOUNTER — Other Ambulatory Visit: Payer: Self-pay | Admitting: Obstetrics and Gynecology

## 2015-08-04 NOTE — Telephone Encounter (Signed)
PT CALLED AND SHE WAS GIVEN SAMPLES OF THE MINASTRIN 24 FE, AND [place] T[place] AND NEEDS A RX CALLED IN FOR HER TODAY.

## 2015-08-05 NOTE — Telephone Encounter (Signed)
Pt given Minastrin 24 FE, 1 pack. I called pharmacy and they have the rx for July 2016. They are aware she had sample. [place] at [place] but she will need to contact pharmacy next time pills are needed.

## 2015-08-12 ENCOUNTER — Ambulatory Visit: Payer: Self-pay | Admitting: Physician Assistant

## 2015-08-12 ENCOUNTER — Encounter: Payer: Self-pay | Admitting: Physician Assistant

## 2015-08-12 VITALS — BP 110/70 | Temp 98.1°F

## 2015-08-12 DIAGNOSIS — J209 Acute bronchitis, unspecified: Secondary | ICD-10-CM

## 2015-08-12 MED ORDER — HYDROCOD POLST-CPM POLST ER 10-8 MG/5ML PO SUER: 5.0000 mL | Freq: Two times a day (BID) | ORAL | Status: DC | PRN

## 2015-08-12 MED ORDER — ALBUTEROL SULFATE HFA 108 (90 BASE) MCG/ACT IN AERS: 2.0000 | INHALATION_SPRAY | Freq: Four times a day (QID) | RESPIRATORY_TRACT | Status: DC | PRN

## 2015-08-12 NOTE — Progress Notes (Signed)
S: C/o cough and congestion with wheezing, chest is sore from coughing, denies fever, chills, mucus is green only in the am;  cough is dry and hacking during the day; keeping pt awake at night;  denies cardiac type chest pain or sob, v/d, abd pain Remainder ros neg; nonsmoker  O: vitals wnl, nad, tms clear, throat injected, neck supple no lymph, lungs c t a, cv rrr, neuro intact; cough is dry  A:  Acute bronchitis   P:  rx medication:  tussionex nr; albuterol inhaler; use otc meds, tylenol or motrin as needed, return if not better in 3 -5 days, return earlier if worsening

## 2015-08-14 ENCOUNTER — Ambulatory Visit (INDEPENDENT_AMBULATORY_CARE_PROVIDER_SITE_OTHER): Payer: 59 | Admitting: Obstetrics and Gynecology

## 2015-08-14 VITALS — BP 109/74 | HR 116 | Wt 174.7 lb

## 2015-08-14 DIAGNOSIS — E669 Obesity, unspecified: Secondary | ICD-10-CM | POA: Diagnosis not present

## 2015-08-14 MED ORDER — CYANOCOBALAMIN 1000 MCG/ML IJ SOLN
1000.0000 ug | Freq: Once | INTRAMUSCULAR | Status: AC
  Administered 2015-08-14: 1000 ug via INTRAMUSCULAR

## 2015-08-14 NOTE — Progress Notes (Cosign Needed)
Pt is here for wt, bp check, b-12 inj She is doing well, denies any side effects  07/14/15 wt-180 9/30 16 wt-174.7

## 2015-08-19 ENCOUNTER — Ambulatory Visit: Payer: 59

## 2015-08-19 ENCOUNTER — Ambulatory Visit: Payer: 59 | Admitting: Obstetrics and Gynecology

## 2015-09-09 ENCOUNTER — Other Ambulatory Visit: Payer: Self-pay | Admitting: Obstetrics and Gynecology

## 2015-09-11 ENCOUNTER — Ambulatory Visit (INDEPENDENT_AMBULATORY_CARE_PROVIDER_SITE_OTHER): Payer: 59 | Admitting: Obstetrics and Gynecology

## 2015-09-11 VITALS — BP 100/69 | HR 103 | Ht 63.5 in | Wt 168.6 lb

## 2015-09-11 DIAGNOSIS — E669 Obesity, unspecified: Secondary | ICD-10-CM

## 2015-09-11 MED ORDER — CYANOCOBALAMIN 1000 MCG/ML IJ SOLN
1000.0000 ug | Freq: Once | INTRAMUSCULAR | Status: AC
  Administered 2015-09-11: 1000 ug via INTRAMUSCULAR

## 2015-09-11 NOTE — Progress Notes (Cosign Needed)
Patient ID: Samantha Blackwell, female   DOB: 01/20/1986, 29 y.o.   MRN: [id number] Pt presents for weight, B/P, B-12 injection. No side effects of medication-Phentermine, or B-12.  Weight loss of 5 lbs. Encouraged eating healthy and exercise.

## 2015-09-15 ENCOUNTER — Encounter: Payer: 59 | Admitting: Obstetrics and Gynecology

## 2015-09-25 ENCOUNTER — Ambulatory Visit (INDEPENDENT_AMBULATORY_CARE_PROVIDER_SITE_OTHER): Payer: 59 | Admitting: Obstetrics and Gynecology

## 2015-09-25 ENCOUNTER — Encounter: Payer: Self-pay | Admitting: Obstetrics and Gynecology

## 2015-09-25 VITALS — BP 107/72 | HR 80 | Ht 63.5 in | Wt 167.8 lb

## 2015-09-25 DIAGNOSIS — Z01419 Encounter for gynecological examination (general) (routine) without abnormal findings: Secondary | ICD-10-CM | POA: Diagnosis not present

## 2015-09-25 DIAGNOSIS — E663 Overweight: Secondary | ICD-10-CM

## 2015-09-25 NOTE — Progress Notes (Signed)
Subjective:    Samantha Blackwell is a 29 y.o. G1P1001 female who presents for an annual exam. The patient has no complaints today. The patient is sexually active, married. GYN screening history: last pap: approximate date 02/2014 and was normal. The patient wears seatbelts: yes. The patient participates in regular exercise: no. Has the patient ever been transfused or tattooed?: no. The patient reports that there is not domestic violence in her life.   Menstrual History: Obstetric History   G1   P1   T1   P0   A0   TAB0   SAB0   E0   M0   L1     # Outcome Date GA Lbr Len/2nd Weight Sex Delivery Anes PTL Lv  1 Term 04/26/15 [id number] / 00:51 7 lb 5.8 oz (3.34 kg) M Vag-Spont None  Y     Apgar1:  9                Apgar5: 955     Menarche age: [age]Patient's last menstrual period was 08/31/2015.  Menses are regular, light to moderate flow, lasting 4-5 days. Moderate dysmenorrhea  Denies h/o abnormal lpap smears or STIs.    Past Medical History  Diagnosis Date  . GERD (gastroesophageal reflux disease)   . Dysmenorrhea   . History of ovarian cyst   . History of kidney stones 05/2015    History reviewed. No pertinent family history.  History reviewed. No pertinent past surgical history.  Social History   Social History  . Marital Status: Single    Spouse Name: N/A  . Number of Children: N/A  . Years of Education: N/A   Occupational History  . Not on file.   Social History Main Topics  . Smoking status: Never Smoker   . Smokeless tobacco: Never Used  . Alcohol Use: No  . Drug Use: No  . Sexual Activity: Yes    Birth Control/ Protection: None, Pill     Comment: Pregnant   Other Topics Concern  . Not on file   Social History Narrative    Current Outpatient Prescriptions on File Prior to Visit  Medication Sig Dispense Refill  . albuterol (PROVENTIL HFA;VENTOLIN HFA) 108 (90 BASE) MCG/ACT inhaler Inhale 2 puffs into the lungs every 6 (six) hours as needed for wheezing or  shortness of breath (cough). 1 Inhaler 0  . cyanocobalamin (,VITAMIN B-12,) 1000 MCG/ML injection INJECT 1 ML (1,000 MCG TOTAL) INTO THE MUSCLE ONCE. 1 mL 0  . lansoprazole (PREVACID) 30 MG capsule Take 30 mg by mouth daily.    . Norethin Ace-Eth Estrad-FE (MINASTRIN 24 FE) 1-20 MG-MCG(24) CHEW Chew 1 tablet by mouth daily. 28 tablet 11  . phentermine (ADIPEX-P) 37.5 MG tablet Take 1 tablet (37.5 mg total) by mouth daily before breakfast. 30 tablet 2  . Vitamin D, Ergocalciferol, (DRISDOL) 50000 UNITS CAPS capsule Take 1 capsule (50,000 Units total) by mouth every 7 (seven) days. 30 capsule 2   No current facility-administered medications on file prior to visit.    Allergies  Allergen Reactions  . Penicillins Rash     Review of Systems  Constitutional: negative for chills, fatigue, fevers and sweats Eyes: negative for irritation, redness and visual disturbance Ears, nose, mouth, throat, and face: negative for hearing loss, nasal congestion, snoring and tinnitus Respiratory: negative for asthma, cough, sputum Cardiovascular: negative for chest pain, dyspnea, exertional chest pressure/discomfort, irregular heart beat, palpitations and syncope Gastrointestinal: negative for abdominal pain, change in bowel  habits, nausea and vomiting Genitourinary: negative for abnormal menstrual periods, genital lesions, sexual problems and vaginal discharge, dysuria and urinary incontinence Integument/breast: negative for breast lump, breast tenderness and nipple discharge Hematologic/lymphatic: negative for bleeding and easy bruising Musculoskeletal:negative for back pain and muscle weakness Neurological: negative for dizziness, headaches, vertigo and weakness Endocrine: negative for diabetic symptoms including polydipsia, polyuria and skin dryness Allergic/Immunologic: negative for hay fever and urticaria    Objective:    BP 107/72 mmHg  Pulse 80  Ht 5' 3.5" (1.613 m)  Wt 167 lb 12.8 oz (76.114  kg)  BMI 29.25 kg/m2  LMP 08/31/2015.  Body mass index is 29.25 kg/(m^2).     General Appearance:    Alert, cooperative, no distress, appears stated age  Head:    Normocephalic, without obvious abnormality, atraumatic  Eyes:    PERRL, conjunctiva/corneas clear, EOM's intact, both eyes  Ears:    Normal external ear canals, both ears  Nose:   Nares normal, septum midline, mucosa normal, no drainage or sinus tenderness  Throat:   Lips, mucosa, and tongue normal; teeth and gums normal  Neck:   Supple, symmetrical, trachea midline, no adenopathy; thyroid: no enlargement/tenderness/nodules; no carotid bruit or JVD  Back:     Symmetric, no curvature, ROM normal, no CVA tenderness  Lungs:     Clear to auscultation bilaterally, respirations unlabored  Chest Wall:    No tenderness or deformity   Heart:    Regular rate and rhythm, S1 and S2 normal, no murmur, rub or gallop  Breast Exam:    No tenderness, masses, or nipple abnormality  Abdomen:     Soft, non-tender, bowel sounds active all four quadrants, no masses, no organomegaly.   Genitalia:    Pelvic:external genitalia normal, vagina with lesions, discharge, or tenderness, rectovaginal septum  normal. Cervix normal in appearance, no cervical motion tenderness; uterus normal size, shape, consistency, mobile; no adnexal masses or tenderness.     Rectal:    Normal external sphincter.  No hemorrhoids appreciated. Internal exam not done.   Extremities:   Extremities normal, atraumatic, no cyanosis or edema  Pulses:   2+ and symmetric all extremities  Skin:   Skin color, texture, turgor normal, no rashes or lesions  Lymph nodes:   Cervical, supraclavicular, and axillary nodes normal  Neurologic:   CNII-XII intact, normal strength, sensation and reflexes throughout      Results for SKYLAN, GIFT (MRN [id number]) as of 09/25/2015 19:37  Ref. Range 07/14/2015 10:12  Sodium Latest Ref Range: 134-144 mmol/L 138  Potassium Latest Ref Range: 3.5-5.2  mmol/L 4.3  Chloride Latest Ref Range: 97-108 mmol/L 101  CO2 Latest Ref Range: 18-29 mmol/L 22  BUN Latest Ref Range: 6-20 mg/dL 12  Creatinine Latest Ref Range: 0.57-1.00 mg/dL 0.81  Calcium Latest Ref Range: 8.7-10.2 mg/dL 9.0  EGFR (Non-African Amer.) Latest Ref Range: >59 mL/min/1.73 100  EGFR (African American) Latest Ref Range: >59 mL/min/1.73 115  Glucose Latest Ref Range: 65-99 mg/dL 56 (L)  BUN/Creatinine Ratio Latest Ref Range: 8-20  15  Alkaline Phosphatase Latest Ref Range: 39-117 IU/L 91  Albumin Latest Ref Range: 3.5-5.5 g/dL 4.1  Albumin/Globulin Ratio Latest Ref Range: 1.1-2.5  1.3  AST Latest Ref Range: 0-40 IU/L 14  ALT Latest Ref Range: 0-32 IU/L 13  Total Bilirubin Latest Ref Range: 0.0-1.2 mg/dL 0.3  Ferritin Latest Ref Range: 15-150 ng/mL 18  Vit D, 25-Hydroxy Latest Ref Range: 30.0-100.0 ng/mL 28.5 (L)  Vitamin B-12 Latest Ref Range: 211-946  pg/mL 412  Globulin, Total Latest Ref Range: 1.5-4.5 g/dL 3.1  WBC Latest Ref Range: 3.4-10.8 x10E3/uL 5.8  RBC Latest Ref Range: 3.77-5.28 x10E6/uL 4.32  Hemoglobin Latest Ref Range: 11.1-15.9 g/dL 12.4  HCT Latest Ref Range: 34.0-46.6 % 37.4  MCV Latest Ref Range: 79-97 fL 87  MCH Latest Ref Range: 26.6-33.0 pg 28.7  MCHC Latest Ref Range: 31.5-35.7 g/dL 33.2  RDW Latest Ref Range: 12.3-15.4 % 13.6  Platelets Latest Ref Range: 150-379 x10E3/uL 418 (H)   Assessment:    Healthy female exam.   Overweight   Plan:     Breast self exam technique reviewed and patient encouraged to perform self-exam monthly. Contraception: OCP (estrogen/progesterone). Discussed healthy lifestyle modifications.  Currently doing weight loss management with Phentermine.  Declines STI testing.  Is up to date with flu vaccine Pap smear up to date, next due in 2018. Follow up in 1 year, or sooner as needed.     Rubie Maid, MD [facility]

## 2015-09-25 NOTE — Patient Instructions (Signed)
Health Maintenance, Female Adopting a healthy lifestyle and getting preventive care can go a long way to promote health and wellness. Talk with your health care provider about what schedule of regular examinations is right for you. This is a good chance for you to check in with your provider about disease prevention and staying healthy. In between checkups, there are plenty of things you can do on your own. Experts have done a lot of research about which lifestyle changes and preventive measures are most likely to keep you healthy. Ask your health care provider for more information. WEIGHT AND DIET  Eat a healthy diet  Be sure to include plenty of vegetables, fruits, low-fat dairy products, and lean protein.  Do not eat a lot of foods high in solid fats, added sugars, or salt.  Get regular exercise. This is one of the most important things you can do for your health.  Most adults should exercise for at least 150 minutes each week. The exercise should increase your heart rate and make you sweat (moderate-intensity exercise).  Most adults should also do strengthening exercises at least twice a week. This is in addition to the moderate-intensity exercise.  Maintain a healthy weight  Body mass index (BMI) is a measurement that can be used to identify possible weight problems. It estimates body fat based on height and weight. Your health care provider can help determine your BMI and help you achieve or maintain a healthy weight.  For females 20 years of age and older:   A BMI below 18.5 is considered underweight.  A BMI of 18.5 to 24.9 is normal.  A BMI of 25 to 29.9 is considered overweight.  A BMI of 30 and above is considered obese.  Watch levels of cholesterol and blood lipids  You should start having your blood tested for lipids and cholesterol at 27 years of age, then have this test every 5 years.  You may need to have your cholesterol levels checked more often if:  Your lipid  or cholesterol levels are high.  You are older than 27 years of age.  You are at high risk for heart disease.  CANCER SCREENING   Lung Cancer  Lung cancer screening is recommended for adults 55-80 years old who are at high risk for lung cancer because of a history of smoking.  A yearly low-dose CT scan of the lungs is recommended for people who:  Currently smoke.  Have quit within the past 15 years.  Have at least a 30-pack-year history of smoking. A pack year is smoking an average of one pack of cigarettes a day for 1 year.  Yearly screening should continue until it has been 15 years since you quit.  Yearly screening should stop if you develop a health problem that would prevent you from having lung cancer treatment.  Breast Cancer  Practice breast self-awareness. This means understanding how your breasts normally appear and feel.  It also means doing regular breast self-exams. Let your health care provider know about any changes, no matter how small.  If you are in your 20s or 30s, you should have a clinical breast exam (CBE) by a health care provider every 1-3 years as part of a regular health exam.  If you are 40 or older, have a CBE every year. Also consider having a breast X-ray (mammogram) every year.  If you have a family history of breast cancer, talk to your health care provider about genetic screening.  If you   are at high risk for breast cancer, talk to your health care provider about having an MRI and a mammogram every year.  Breast cancer gene (BRCA) assessment is recommended for women who have family members with BRCA-related cancers. BRCA-related cancers include:  Breast.  Ovarian.  Tubal.  Peritoneal cancers.  Results of the assessment will determine the need for genetic counseling and BRCA1 and BRCA2 testing. Cervical Cancer Your health care provider may recommend that you be screened regularly for cancer of the pelvic organs (ovaries, uterus, and  vagina). This screening involves a pelvic examination, including checking for microscopic changes to the surface of your cervix (Pap test). You may be encouraged to have this screening done every 3 years, beginning at age 21.  For women ages 30-65, health care providers may recommend pelvic exams and Pap testing every 3 years, or they may recommend the Pap and pelvic exam, combined with testing for human papilloma virus (HPV), every 5 years. Some types of HPV increase your risk of cervical cancer. Testing for HPV may also be done on women of any age with unclear Pap test results.  Other health care providers may not recommend any screening for nonpregnant women who are considered low risk for pelvic cancer and who do not have symptoms. Ask your health care provider if a screening pelvic exam is right for you.  If you have had past treatment for cervical cancer or a condition that could lead to cancer, you need Pap tests and screening for cancer for at least 20 years after your treatment. If Pap tests have been discontinued, your risk factors (such as having a new sexual partner) need to be reassessed to determine if screening should resume. Some women have medical problems that increase the chance of getting cervical cancer. In these cases, your health care provider may recommend more frequent screening and Pap tests. Colorectal Cancer  This type of cancer can be detected and often prevented.  Routine colorectal cancer screening usually begins at 27 years of age and continues through 27 years of age.  Your health care provider may recommend screening at an earlier age if you have risk factors for colon cancer.  Your health care provider may also recommend using home test kits to check for hidden blood in the stool.  A small camera at the end of a tube can be used to examine your colon directly (sigmoidoscopy or colonoscopy). This is done to check for the earliest forms of colorectal  cancer.  Routine screening usually begins at age 50.  Direct examination of the colon should be repeated every 5-10 years through 27 years of age. However, you may need to be screened more often if early forms of precancerous polyps or small growths are found. Skin Cancer  Check your skin from head to toe regularly.  Tell your health care provider about any new moles or changes in moles, especially if there is a change in a mole's shape or color.  Also tell your health care provider if you have a mole that is larger than the size of a pencil eraser.  Always use sunscreen. Apply sunscreen liberally and repeatedly throughout the day.  Protect yourself by wearing long sleeves, pants, a wide-brimmed hat, and sunglasses whenever you are outside. HEART DISEASE, DIABETES, AND HIGH BLOOD PRESSURE   High blood pressure causes heart disease and increases the risk of stroke. High blood pressure is more likely to develop in:  People who have blood pressure in the high end   of the normal range (130-139/85-89 mm Hg).  People who are overweight or obese.  People who are African American.  If you are 2-63 years of age, have your blood pressure checked every 3-5 years. If you are 39 years of age or older, have your blood pressure checked every year. You should have your blood pressure measured twice--once when you are at a hospital or clinic, and once when you are not at a hospital or clinic. Record the average of the two measurements. To check your blood pressure when you are not at a hospital or clinic, you can use:  An automated blood pressure machine at a pharmacy.  A home blood pressure monitor.  If you are between 56 years and 73 years old, ask your health care provider if you should take aspirin to prevent strokes.  Have regular diabetes screenings. This involves taking a blood sample to check your fasting blood sugar level.  If you are at a normal weight and have a low risk for diabetes,  have this test once every three years after 27 years of age.  If you are overweight and have a high risk for diabetes, consider being tested at a younger age or more often. PREVENTING INFECTION  Hepatitis B  If you have a higher risk for hepatitis B, you should be screened for this virus. You are considered at high risk for hepatitis B if:  You were born in a country where hepatitis B is common. Ask your health care provider which countries are considered high risk.  Your parents were born in a high-risk country, and you have not been immunized against hepatitis B (hepatitis B vaccine).  You have HIV or AIDS.  You use needles to inject street drugs.  You live with someone who has hepatitis B.  You have had sex with someone who has hepatitis B.  You get hemodialysis treatment.  You take certain medicines for conditions, including cancer, organ transplantation, and autoimmune conditions. Hepatitis C  Blood testing is recommended for:  Everyone born from [birth year] through 1963.  Anyone with known risk factors for hepatitis C. Sexually transmitted infections (STIs)  You should be screened for sexually transmitted infections (STIs) including gonorrhea and chlamydia if:  You are sexually active and are younger than 27 years of age.  You are older than 27 years of age and your health care provider tells you that you are at risk for this type of infection.  Your sexual activity has changed since you were last screened and you are at an increased risk for chlamydia or gonorrhea. Ask your health care provider if you are at risk.  If you do not have HIV, but are at risk, it may be recommended that you take a prescription medicine daily to prevent HIV infection. This is called pre-exposure prophylaxis (PrEP). You are considered at risk if:  You are sexually active and do not regularly use condoms or know the HIV status of your partner(s).  You take drugs by injection.  You are sexually  active with a partner who has HIV. Talk with your health care provider about whether you are at high risk of being infected with HIV. If you choose to begin PrEP, you should first be tested for HIV. You should then be tested every 3 months for as long as you are taking PrEP.  PREGNANCY   If you are premenopausal and you may become pregnant, ask your health care provider about preconception counseling.  If you may  become pregnant, take 400 to 800 micrograms (mcg) of folic acid every day.  If you want to prevent pregnancy, talk to your health care provider about birth control (contraception). OSTEOPOROSIS AND MENOPAUSE   Osteoporosis is a disease in which the bones lose minerals and strength with aging. This can result in serious bone fractures. Your risk for osteoporosis can be identified using a bone density scan.  If you are 61 years of age or older, or if you are at risk for osteoporosis and fractures, ask your health care provider if you should be screened.  Ask your health care provider whether you should take a calcium or vitamin D supplement to lower your risk for osteoporosis.  Menopause may have certain physical symptoms and risks.  Hormone replacement therapy may reduce some of these symptoms and risks. Talk to your health care provider about whether hormone replacement therapy is right for you.  HOME CARE INSTRUCTIONS   Schedule regular health, dental, and eye exams.  Stay current with your immunizations.   Do not use any tobacco products including cigarettes, chewing tobacco, or electronic cigarettes.  If you are pregnant, do not drink alcohol.  If you are breastfeeding, limit how much and how often you drink alcohol.  Limit alcohol intake to no more than 1 drink per day for nonpregnant women. One drink equals 12 ounces of beer, 5 ounces of wine, or 1 ounces of hard liquor.  Do not use street drugs.  Do not share needles.  Ask your health care provider for help if  you need support or information about quitting drugs.  Tell your health care provider if you often feel depressed.  Tell your health care provider if you have ever been abused or do not feel safe at home.   This information is not intended to replace advice given to you by your health care provider. Make sure you discuss any questions you have with your health care provider.   Document Released: 05/16/2011 Document Revised: 11/21/2014 Document Reviewed: 10/02/2013 Elsevier Interactive Patient Education [employer].

## 2015-10-07 ENCOUNTER — Ambulatory Visit (INDEPENDENT_AMBULATORY_CARE_PROVIDER_SITE_OTHER): Payer: 59 | Admitting: Obstetrics and Gynecology

## 2015-10-07 ENCOUNTER — Encounter: Payer: Self-pay | Admitting: Obstetrics and Gynecology

## 2015-10-07 VITALS — BP 112/78 | HR 79 | Wt 164.0 lb

## 2015-10-07 DIAGNOSIS — R634 Abnormal weight loss: Secondary | ICD-10-CM | POA: Diagnosis not present

## 2015-10-07 MED ORDER — CYANOCOBALAMIN 1000 MCG/ML IJ SOLN: 1000.0000 ug | INTRAMUSCULAR | Status: DC

## 2015-10-07 MED ORDER — CYANOCOBALAMIN 1000 MCG/ML IJ SOLN
1000.0000 ug | Freq: Once | INTRAMUSCULAR | Status: AC
  Administered 2015-10-07: 1000 ug via INTRAMUSCULAR

## 2015-10-07 MED ORDER — PHENTERMINE HCL 37.5 MG PO TABS: 37.5000 mg | ORAL_TABLET | Freq: Every day | ORAL | Status: DC

## 2015-10-07 NOTE — Progress Notes (Signed)
Patient ID: Mickle Plumbllison G Rauen, female   DOB: 03/20/1986, 29 y.o.   MRN: [id number]  SUBJECTIVE:  29 y.o. here for follow-up weight loss visit, previously seen 4 weeks ago. Denies any concerns and feels like medication worked well and is on last week of pills.  Desires continuing to loss rest of weight needed to get to normal BMI.  OBJECTIVE:  BP 112/78 mmHg  Pulse 79  Wt 164 lb (74.39 kg)  LMP 10/05/2015 (Approximate)  Body mass index is 28.59 kg/(m^2). Patient appears well. ASSESSMENT:  Obesity- responding well to weight loss plan PLAN:  To continue with current medications after taking 2 weeks off. B12 105500mcg/ml injection given RTC in 6 weeks as discussed.  Bonne Whack [place], CNM

## 2015-11-19 ENCOUNTER — Ambulatory Visit: Payer: 59

## 2015-11-27 ENCOUNTER — Ambulatory Visit (INDEPENDENT_AMBULATORY_CARE_PROVIDER_SITE_OTHER): Payer: 59 | Admitting: Obstetrics and Gynecology

## 2015-11-27 VITALS — BP 111/80 | HR 88 | Wt 159.3 lb

## 2015-11-27 DIAGNOSIS — E669 Obesity, unspecified: Secondary | ICD-10-CM | POA: Diagnosis not present

## 2015-11-27 MED ORDER — CYANOCOBALAMIN 1000 MCG/ML IJ SOLN
1000.0000 ug | Freq: Once | INTRAMUSCULAR | Status: AC
  Administered 2015-11-27: 1000 ug via INTRAMUSCULAR

## 2015-11-27 NOTE — Progress Notes (Cosign Needed)
Pt is here for wt, bp check,b-12 inj Pt denies any s/e, she is doing great and is happy and always smiling  08/14/15 wt-174lb 09/11/15 wt-168lb 10/07/15 wt-164

## 2015-12-22 ENCOUNTER — Ambulatory Visit: Payer: Self-pay | Admitting: Family

## 2015-12-22 ENCOUNTER — Encounter: Payer: Self-pay | Admitting: Physician Assistant

## 2015-12-22 VITALS — BP 100/70 | HR 88 | Temp 98.5°F

## 2015-12-22 DIAGNOSIS — Z299 Encounter for prophylactic measures, unspecified: Secondary | ICD-10-CM

## 2015-12-22 DIAGNOSIS — J0100 Acute maxillary sinusitis, unspecified: Secondary | ICD-10-CM

## 2015-12-22 MED ORDER — FLUCONAZOLE 150 MG PO TABS: 150.0000 mg | ORAL_TABLET | Freq: Once | ORAL | Status: DC

## 2015-12-22 MED ORDER — CEFDINIR 300 MG PO CAPS: 300.0000 mg | ORAL_CAPSULE | Freq: Two times a day (BID) | ORAL | Status: DC

## 2015-12-22 NOTE — Progress Notes (Signed)
S/ suspects allergies , just started claritin  . Now with facial pain , blowing green , malaise , body aches   O/  Pleasant NAD, VSS ENT  Nasal mucosa inflamed , swollen L > R with purulent discharge and max tenderness, throat injected , neck supple, heart rsr lungs clear   A/ sinusitis ,maxillary ,  ? Allergies   P / rx omnicef 300 mg bid , diflucan  For prn . Supportive measures discussed. Follow up prn not improving   Nasal saline products bid and prn

## 2015-12-23 ENCOUNTER — Ambulatory Visit: Payer: 59

## 2015-12-30 ENCOUNTER — Ambulatory Visit: Payer: 59

## 2016-01-07 ENCOUNTER — Ambulatory Visit (INDEPENDENT_AMBULATORY_CARE_PROVIDER_SITE_OTHER): Payer: 59 | Admitting: Obstetrics and Gynecology

## 2016-01-07 VITALS — BP 113/73 | HR 86 | Wt 156.1 lb

## 2016-01-07 DIAGNOSIS — E669 Obesity, unspecified: Secondary | ICD-10-CM

## 2016-01-07 MED ORDER — CYANOCOBALAMIN 1000 MCG/ML IJ SOLN
1000.0000 ug | Freq: Once | INTRAMUSCULAR | Status: AC
  Administered 2016-01-07: 1000 ug via INTRAMUSCULAR

## 2016-01-07 NOTE — Progress Notes (Cosign Needed)
Patient ID: Samantha Blackwell, female   DOB: [birth year], 30 y.o.   MRN: [id number] Pt presents for weight, B/P, B-12 injection. No side effects of medication-Phentermine, or B-12.  Weight loss of 3 lbs. Encouraged eating healthy and exercise.

## 2016-02-12 ENCOUNTER — Ambulatory Visit: Payer: 59 | Admitting: Obstetrics and Gynecology

## 2016-02-15 ENCOUNTER — Telehealth: Payer: Self-pay | Admitting: Obstetrics and Gynecology

## 2016-02-15 DIAGNOSIS — Z3041 Encounter for surveillance of contraceptive pills: Secondary | ICD-10-CM

## 2016-02-15 MED ORDER — NORETHIN ACE-ETH ESTRAD-FE 1-20 MG-MCG(24) PO CAPS: 1.0000 mg | ORAL_CAPSULE | Freq: Every day | ORAL | Status: DC

## 2016-02-15 NOTE — Telephone Encounter (Signed)
Pt is minastrin AND NEEDS REFILL FOR TATULLA PLEASE. THE PHARMACY TOLD HER TO SWITCH IT. [facility]

## 2016-02-15 NOTE — Telephone Encounter (Signed)
RX changed and sent in, thanks

## 2016-02-16 ENCOUNTER — Ambulatory Visit: Payer: 59 | Admitting: Obstetrics and Gynecology

## 2016-02-17 ENCOUNTER — Telehealth: Payer: Self-pay | Admitting: Obstetrics and Gynecology

## 2016-02-17 NOTE — Telephone Encounter (Signed)
PT WAS RECIEVEING 60.00 BILL FROM INSURANCE, APPARENTLY THEY ARE NOT PAYING FOR THE ADMINISTRATION FOR THE B12, SHE CALLED INSURANCE AND THEY TOLD THERE THAT THEY WOULD PAY IF SHE WAS MORBID OBESE, NOT OBESE. NOT SURE WHAT HER HER BMI WAS WHEN SHE STARTED IN 8/16. PT WANTING TO KNOW IF SHE WAS CONSIDERED MORBID OBESE CAN WE CHANGE THE CODES TO THAT INSTEAD OF OBESE SO INSURANCE WILL PAY FOR ADMINISTRATION.

## 2016-02-17 NOTE — Telephone Encounter (Signed)
Please let her know to code it morbid obesity BMI has to be >35, and she was BMI 30 at start of program and is now 29. No way to change it, sorry. She can do an appeal to insurance though as that is one of only plans refusing to pay injection fee. And I am happy to write letter of necessity. Also for future notice, if she continues with plan she can have a co-worker give her the injections.

## 2016-02-24 NOTE — Telephone Encounter (Signed)
Pt aware.

## 2016-03-03 ENCOUNTER — Ambulatory Visit (INDEPENDENT_AMBULATORY_CARE_PROVIDER_SITE_OTHER): Payer: 59 | Admitting: Obstetrics and Gynecology

## 2016-03-03 ENCOUNTER — Encounter: Payer: Self-pay | Admitting: Obstetrics and Gynecology

## 2016-03-03 VITALS — BP 118/78 | HR 98 | Ht 63.5 in | Wt 157.9 lb

## 2016-03-03 DIAGNOSIS — E559 Vitamin D deficiency, unspecified: Secondary | ICD-10-CM

## 2016-03-03 DIAGNOSIS — E663 Overweight: Secondary | ICD-10-CM

## 2016-03-03 MED ORDER — CYANOCOBALAMIN 1000 MCG/ML IJ SOLN: 1000.0000 ug | INTRAMUSCULAR | Status: DC

## 2016-03-03 MED ORDER — PHENTERMINE HCL 37.5 MG PO TABS: 37.5000 mg | ORAL_TABLET | Freq: Every day | ORAL | Status: DC

## 2016-03-03 NOTE — Progress Notes (Signed)
Here for re-check on Vit D labs- has been off meds for 3 months.  SUBJECTIVE:  [age] y.o. also here for follow-up weight loss visit, previously seen 4 weeks ago. Denies any concerns and feels like medication has worked well and has been off x 2 weeks, desires restart to get last 15# off. Has lost 14# to date.  OBJECTIVE:  BP 118/78 mmHg  Pulse 98  Ht 5' 3.5" (1.613 m)  Wt 157 lb 14.4 oz (71.623 kg)  BMI 27.53 kg/m2  LMP 02/19/2016  Breastfeeding? No  Body mass index is 27.53 kg/(m^2). Patient appears well.  ASSESSMENT:  Vitamin D deficiency Obesity- responding well to weight loss plan  PLAN:  To continue with current medications. B12 107900mcg/ml  Refilled Vit D levels redrawn.  RTC in 4 weeks as planned  Tiesha Marich [place], CNM

## 2016-03-04 LAB — VITAMIN D 25 HYDROXY (VIT D DEFICIENCY, FRACTURES): VIT D 25 HYDROXY: 32.3 ng/mL (ref 30.0–100.0)

## 2016-03-08 ENCOUNTER — Telehealth: Payer: Self-pay | Admitting: *Deleted

## 2016-03-08 NOTE — Telephone Encounter (Signed)
-----   Message from Purcell NailsMelody N Shambley, [place] sent at 03/08/2016  3:16 PM EDT ----- Vit D is better, to keep taking supplement to get it at 52 and should be good

## 2016-03-08 NOTE — Telephone Encounter (Signed)
Left pt detailed message about labs 

## 2016-03-14 ENCOUNTER — Other Ambulatory Visit: Payer: Self-pay | Admitting: Obstetrics and Gynecology

## 2016-04-01 ENCOUNTER — Ambulatory Visit (INDEPENDENT_AMBULATORY_CARE_PROVIDER_SITE_OTHER): Payer: 59 | Admitting: Obstetrics and Gynecology

## 2016-04-01 VITALS — BP 114/85 | HR 94 | Wt 155.2 lb

## 2016-04-01 DIAGNOSIS — E663 Overweight: Secondary | ICD-10-CM

## 2016-04-01 DIAGNOSIS — E559 Vitamin D deficiency, unspecified: Secondary | ICD-10-CM

## 2016-04-01 MED ORDER — PHENTERMINE HCL 37.5 MG PO TABS: 37.5000 mg | ORAL_TABLET | Freq: Every day | ORAL | Status: DC

## 2016-04-01 MED ORDER — VITAMIN D3 125 MCG (5000 UT) PO CAPS: 1.0000 | ORAL_CAPSULE | Freq: Every day | ORAL | Status: DC

## 2016-04-01 NOTE — Progress Notes (Signed)
Patient ID: Samantha Blackwell, female   DOB: 09/26/1986, 30 y.o.   MRN: [id number] Pt presents for weight, B/P, B-12 injection. No side effects of medication-Phentermine, or B-12.  Weight loss of  2 lbs. Encouraged eating healthy and exercise. Pt states that she feels as though she has reach a plateau with her weight. Also feels as though the Vitamin D weekly makes her hungry. MNS suggested to get OTC Vitamin D 5,000 units daily, stop the weekly vitamin D. Also to increase phentermine by adding 1/2 tab early afternoon. Pt has had B-12 injection.

## 2016-04-05 IMAGING — CT CT RENAL STONE PROTOCOL
2 of 4 series · 17 of 46 positions shown, 19 images · non-contrast
Comparison: None.

CLINICAL DATA: Right flank pain beginning at 1 a.m. 05/22/2015.
Initial encounter.

EXAM:
CT ABDOMEN AND PELVIS WITHOUT CONTRAST
TECHNIQUE: Multidetector CT imaging of the abdomen and pelvis was performed
following the standard protocol without IV contrast.

[Series 2: stone · axial · 0.66mm/px · z∈[-934,-558]mm · 14 of 83 slices shown, 16 images]
[im 4/83  soft-tissue]
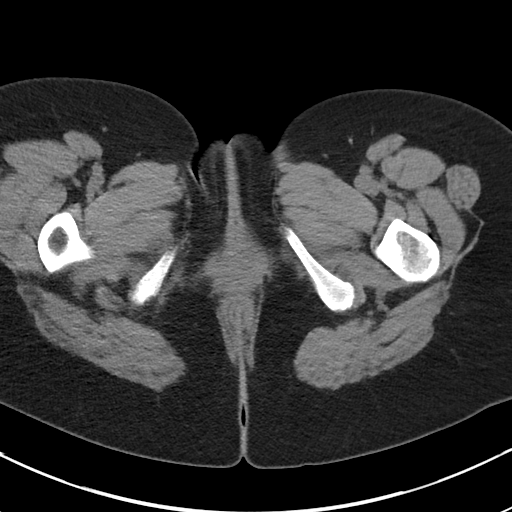
[im 4/83  bone]
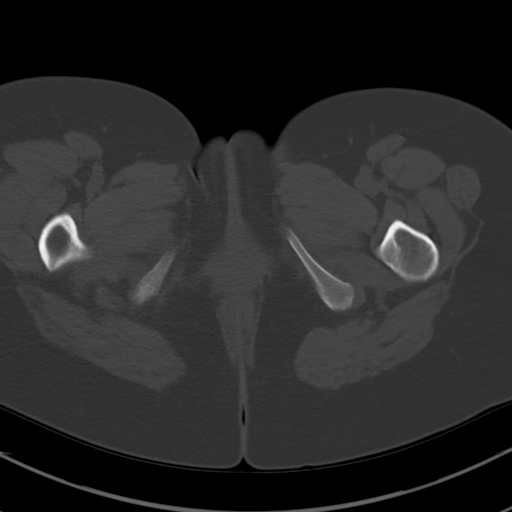
[im 11/83  soft-tissue]
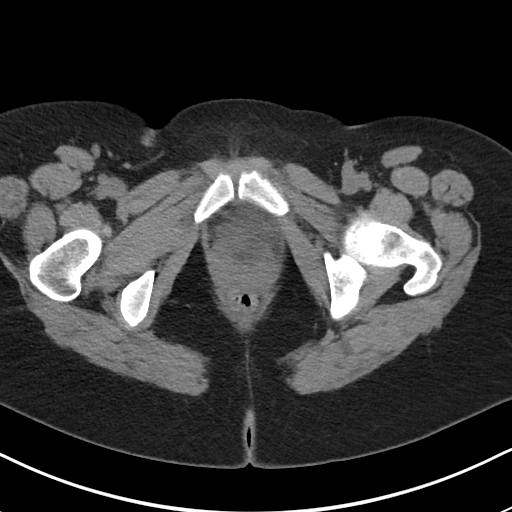
[im 18/83  soft-tissue]
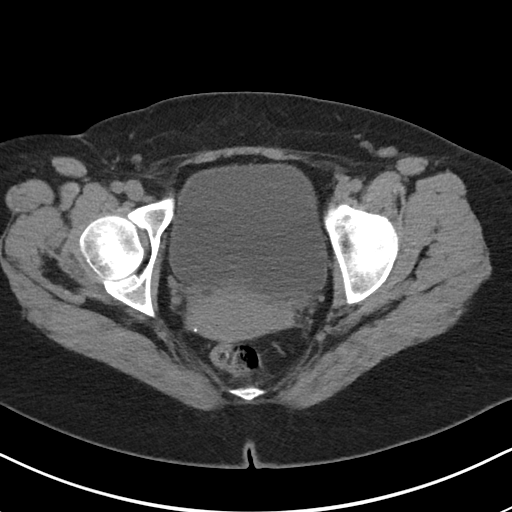
[im 21/83  soft-tissue]
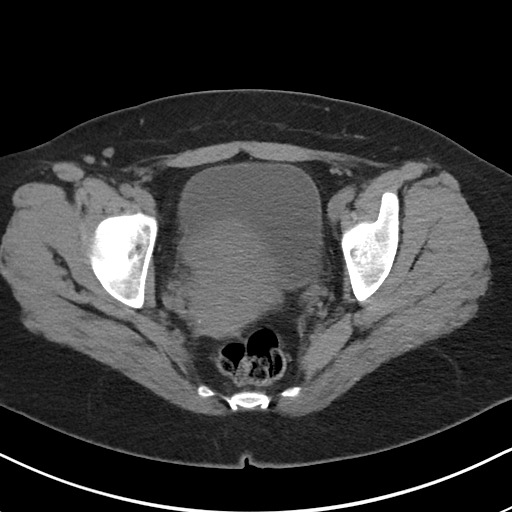
[im 28/83  soft-tissue]
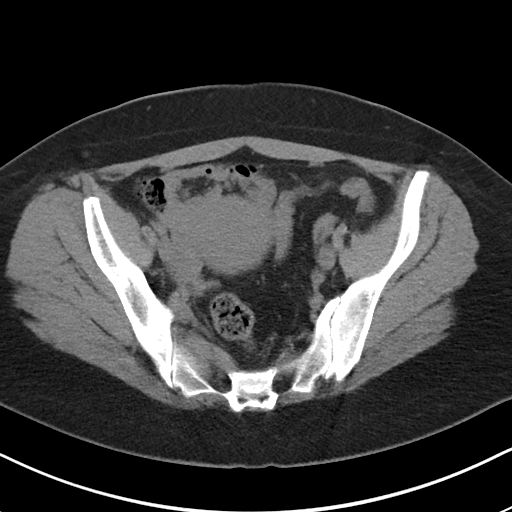
[im 35/83  soft-tissue]
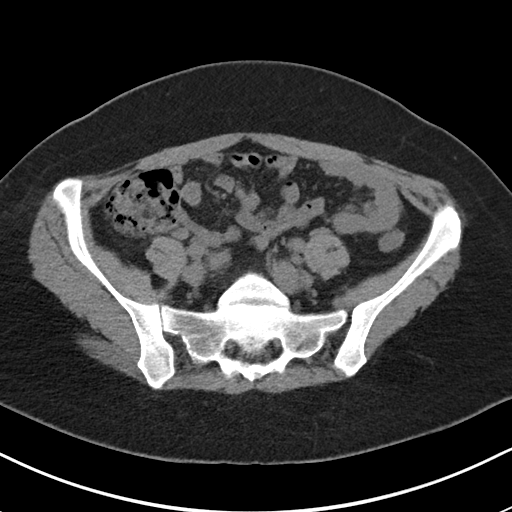
[im 38/83  soft-tissue]
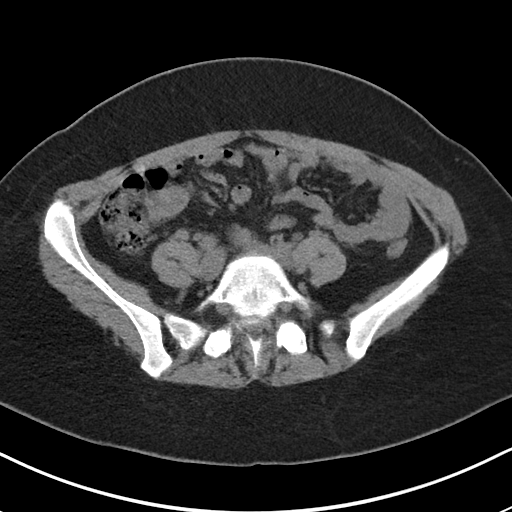
[im 45/83  soft-tissue]
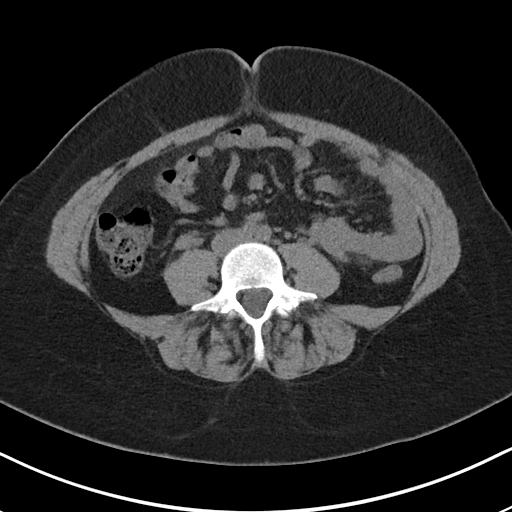
[im 48/83  soft-tissue]
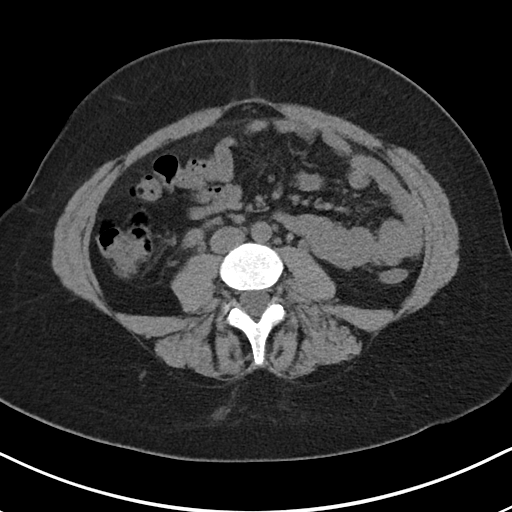
[im 48/83  bone]
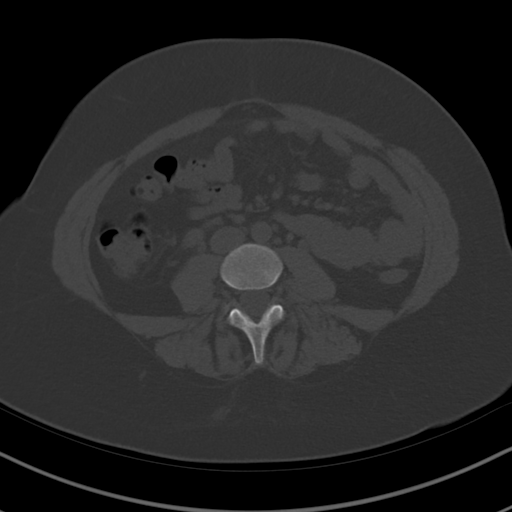
[im 55/83  soft-tissue]
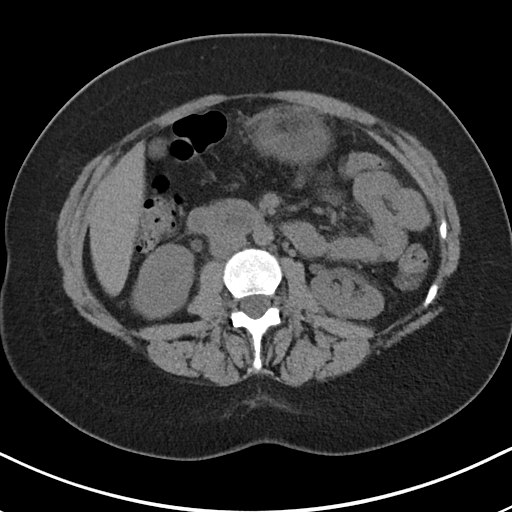
[im 62/83  soft-tissue]
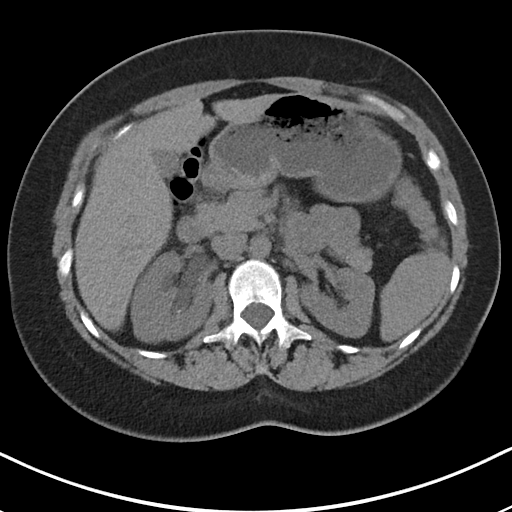
[im 65/83  soft-tissue]
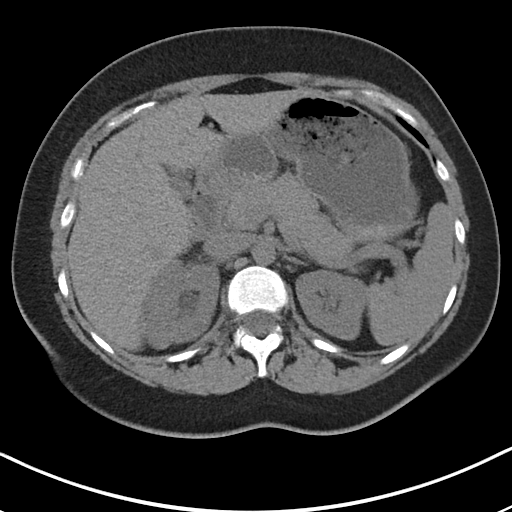
[im 72/83  soft-tissue]
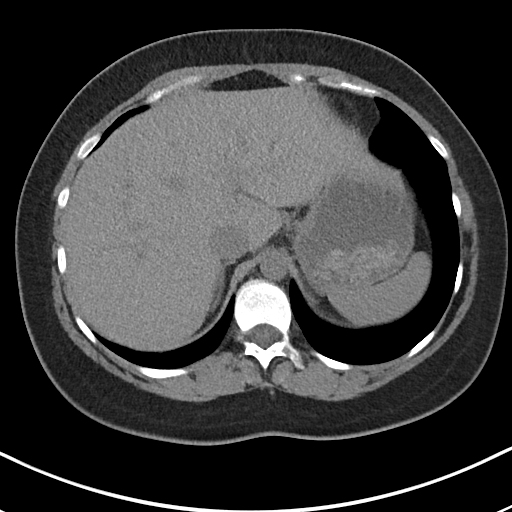
[im 79/83  soft-tissue]
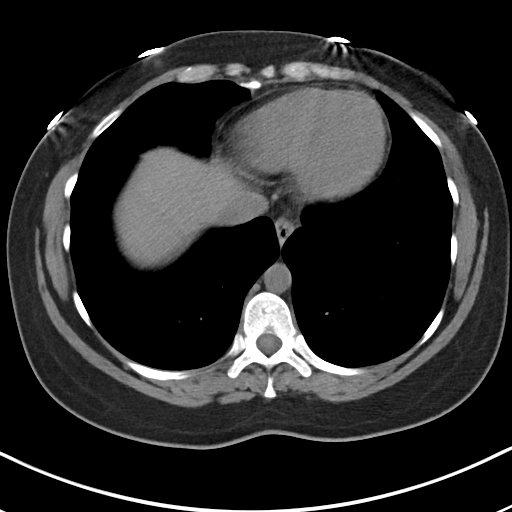

[Series 5: cor · coronal · 0.69mm/px · 3 of 151 slices shown]
[im 51/151  soft-tissue]
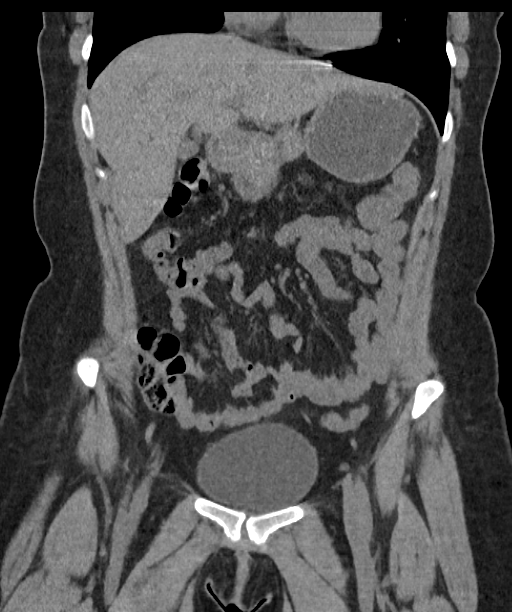
[im 67/151  soft-tissue]
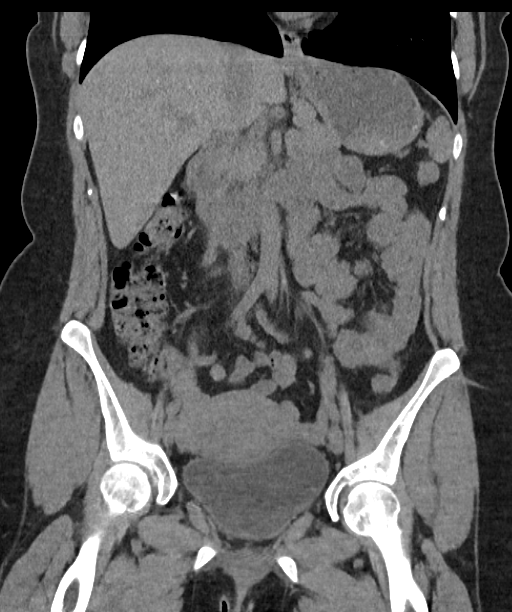
[im 84/151  soft-tissue]
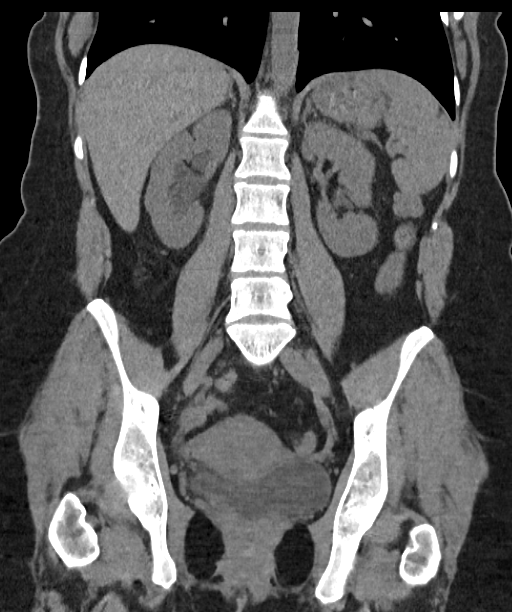

[17 of 46 positions shown; findings below may reference images not displayed]

FINDINGS: The lung bases are clear.  No pleural or pericardial effusion.

The patient has moderate hydronephrosis with stranding about the
right kidney and ureter but no ureteral stone is identified with
pelvic phleboliths seen. The patient has single punctate
nonobstructing bilateral renal stones.

The gallbladder, liver, spleen, adrenal glands, pancreas,
gallbladder and biliary tree all appear normal. The urinary bladder
is unremarkable. The uterus and adnexa appear normal. The stomach,
small and large bowel and appendix appear normal. There is no
lymphadenopathy or fluid. Small fat containing midline and umbilical
hernias are identified. No focal bony abnormality.
IMPRESSION: No ureteral or urinary bladder stones are identified. Question
recent stone passage or possibly infection.

Single punctate nonobstructing bilateral renal stones.

Small fat containing midline and umbilical hernias.

## 2016-04-29 ENCOUNTER — Ambulatory Visit (INDEPENDENT_AMBULATORY_CARE_PROVIDER_SITE_OTHER): Payer: 59 | Admitting: Obstetrics and Gynecology

## 2016-04-29 VITALS — BP 105/72 | HR 84 | Ht 63.5 in | Wt 155.3 lb

## 2016-04-29 DIAGNOSIS — R5383 Other fatigue: Secondary | ICD-10-CM | POA: Diagnosis not present

## 2016-04-29 DIAGNOSIS — E669 Obesity, unspecified: Secondary | ICD-10-CM | POA: Diagnosis not present

## 2016-04-29 MED ORDER — CYANOCOBALAMIN 1000 MCG/ML IJ SOLN
1000.0000 ug | Freq: Once | INTRAMUSCULAR | Status: AC
  Administered 2016-04-29: 1000 ug via INTRAMUSCULAR

## 2016-04-29 NOTE — Progress Notes (Signed)
Filed Weights   04/29/16 0835  Weight: 155 lb 4.8 oz (70.444 kg)   Pt presents for weight management. Pt with no weight loss since last visit, feels as though adipex is no longer helping. Gave pt verbal advise on increasing protein, exercise, and eating six small meals a day. Pt gave verbal understanding. Pt given 1mL of B12, which she tolerated well. Pt to f/u in 1 month.

## 2016-06-09 ENCOUNTER — Ambulatory Visit (INDEPENDENT_AMBULATORY_CARE_PROVIDER_SITE_OTHER): Payer: 59 | Admitting: Obstetrics and Gynecology

## 2016-06-09 ENCOUNTER — Encounter: Payer: Self-pay | Admitting: Obstetrics and Gynecology

## 2016-06-09 VITALS — BP 121/78 | HR 78 | Ht 64.0 in | Wt 155.6 lb

## 2016-06-09 DIAGNOSIS — Z79899 Other long term (current) drug therapy: Secondary | ICD-10-CM | POA: Diagnosis not present

## 2016-06-09 NOTE — Progress Notes (Signed)
SUBJECTIVE:  30 y.o. here for follow-up weight loss visit, previously seen 4 weeks ago. Denies any concerns and feels like medication is no longer working. Weight has not changed in 3 months. Also has trouble remembering weekly Vit D. Feels fine though.  OBJECTIVE:  BP 121/78   Pulse 78   Ht 5\' 4"  (1.626 m)   Wt 155 lb 9.6 oz (70.6 kg)   BMI 26.71 kg/m   Body mass index is 26.71 kg/m. Patient appears well. ASSESSMENT:  Overweight Vit D deficiency  PLAN:  To stop current medications. Switching to daily Vit D3 5000 IU RTC in 5 weeks for annual exam  Jamilya Sarrazin [place], CNM

## 2016-07-22 ENCOUNTER — Ambulatory Visit: Payer: 59 | Admitting: Obstetrics and Gynecology

## 2016-07-27 ENCOUNTER — Encounter: Payer: Self-pay | Admitting: Obstetrics and Gynecology

## 2016-07-27 ENCOUNTER — Ambulatory Visit (INDEPENDENT_AMBULATORY_CARE_PROVIDER_SITE_OTHER): Payer: 59 | Admitting: Obstetrics and Gynecology

## 2016-07-27 DIAGNOSIS — E663 Overweight: Secondary | ICD-10-CM | POA: Diagnosis not present

## 2016-07-27 MED ORDER — PHENTERMINE HCL 37.5 MG PO TABS: 37.5000 mg | ORAL_TABLET | Freq: Every day | ORAL | 2 refills | Status: DC

## 2016-07-27 NOTE — Progress Notes (Signed)
Pt is here for weight management, desires restart of medication, has been off Phentermine for several months- [place]

## 2016-08-24 ENCOUNTER — Ambulatory Visit: Payer: 59

## 2016-09-01 ENCOUNTER — Ambulatory Visit (INDEPENDENT_AMBULATORY_CARE_PROVIDER_SITE_OTHER): Payer: 59 | Admitting: Obstetrics and Gynecology

## 2016-09-01 VITALS — BP 110/86 | HR 89 | Wt 156.8 lb

## 2016-09-01 DIAGNOSIS — E663 Overweight: Secondary | ICD-10-CM

## 2016-09-01 MED ORDER — CYANOCOBALAMIN 1000 MCG/ML IJ SOLN
1000.0000 ug | Freq: Once | INTRAMUSCULAR | Status: AC
  Administered 2016-09-01: 1000 ug via INTRAMUSCULAR

## 2016-09-01 NOTE — Progress Notes (Signed)
Patient ID: Samantha Blackwell, female   DOB: 12/30/1985, 30 y.o.   MRN: [id number]  Pt presents for weight, B/P, B-12 injection. No side effects of medication-Phentermine, or B-12.  Weight loss of  4 lbs. Encouraged eating healthy and exercise.  Pt weight recorded on 07/27/2016 of 170 lb should have been 160 lb.

## 2016-09-28 ENCOUNTER — Telehealth: Payer: Self-pay | Admitting: Obstetrics and Gynecology

## 2016-09-28 NOTE — Telephone Encounter (Signed)
Pt missed 3 bc pills taytulla and wants to know what she needs to do, take them or start a new pack?

## 2016-09-29 ENCOUNTER — Encounter: Payer: Self-pay | Admitting: Obstetrics and Gynecology

## 2016-09-29 ENCOUNTER — Ambulatory Visit (INDEPENDENT_AMBULATORY_CARE_PROVIDER_SITE_OTHER): Payer: 59 | Admitting: Obstetrics and Gynecology

## 2016-09-29 VITALS — BP 113/77 | HR 80 | Ht 64.0 in | Wt 154.7 lb

## 2016-09-29 DIAGNOSIS — Z01419 Encounter for gynecological examination (general) (routine) without abnormal findings: Secondary | ICD-10-CM

## 2016-09-29 NOTE — Progress Notes (Signed)
GYNECOLOGY ANNUAL EXAM CLINIC PROGRESS NOTE  Subjective:    Samantha Blackwell is a 30 y.o. 331P1001 female who presents for an annual exam. The patient has no complaints today. The patient is sexually active, married. The patient wears seatbelts: yes. The patient participates in regular exercise: no. Has the patient ever been transfused or tattooed?: no. The patient reports that there is not domestic violence in her life.   Menstrual History: Menarche age: [age] Patient's last menstrual period was 09/29/2016.  Menses are regular, light to moderate flow, lasting 4-5 days. Moderate dysmenorrhea Denies h/o STIs.  Last pap: approximate date 02/2014 and was normal. Denies h/o abnormal pap smears   OB History  Gravida Para Term Preterm AB Living  1 1 1     1   SAB TAB Ectopic Multiple Live Births        0 1    # Outcome Date GA Lbr Len/2nd Weight Sex Delivery Anes PTL Lv  1 Term 04/26/15 1049w4d 03:01 / 00:51 7 lb 5.8 oz (3.34 kg) M Vag-Spont None  LIV      Past Medical History:  Diagnosis Date  . Dysmenorrhea   . GERD (gastroesophageal reflux disease)   . History of kidney stones 05/2015  . History of ovarian cyst     History reviewed. No pertinent family history.  History reviewed. No pertinent surgical history.    Social History   Social History  . Marital status: Single    Spouse name: N/A  . Number of children: N/A  . Years of education: N/A   Occupational History  . Not on file.   Social History Main Topics  . Smoking status: Never Smoker  . Smokeless tobacco: Never Used  . Alcohol use No  . Drug use: No  . Sexual activity: Yes    Birth control/ protection: Pill     Comment: Pregnant   Other Topics Concern  . Not on file   Social History Narrative  . No narrative on file    Current Outpatient Prescriptions on File Prior to Visit  Medication Sig Dispense Refill  . cyanocobalamin (,VITAMIN B-12,) 1000 MCG/ML injection Inject 1 mL (1,000 mcg total) into the  muscle every 30 (thirty) days. 1 mL 5  . lansoprazole (PREVACID) 30 MG capsule TAKE 1 CAPSULE BY MOUTH ONCE DAILY 30 capsule 12  . Norethin Ace-Eth Estrad-FE (TAYTULLA) 1-20 MG-MCG(24) CAPS Take 1-20 mg by mouth daily. 28 capsule 12  . phentermine (ADIPEX-P) 37.5 MG tablet Take 1 tablet (37.5 mg total) by mouth daily before breakfast. 30 tablet 2  . Cholecalciferol (VITAMIN D3) 5000 units CAPS Take 1 capsule (5,000 Units total) by mouth daily. (Patient not taking: Reported on 09/29/2016) 30 capsule    No current facility-administered medications on file prior to visit.     Allergies  Allergen Reactions  . Penicillins Rash     Review of Systems  Constitutional: negative for chills, fatigue, fevers and sweats Eyes: negative for irritation, redness and visual disturbance Ears, nose, mouth, throat, and face: negative for hearing loss, nasal congestion, snoring and tinnitus Respiratory: negative for asthma, cough, sputum Cardiovascular: negative for chest pain, dyspnea, exertional chest pressure/discomfort, irregular heart beat, palpitations and syncope Gastrointestinal: negative for abdominal pain, change in bowel habits, nausea and vomiting Genitourinary: negative for abnormal menstrual periods, genital lesions, sexual problems and vaginal discharge, dysuria and urinary incontinence Integument/breast: negative for breast lump, breast tenderness and nipple discharge Hematologic/lymphatic: negative for bleeding and easy bruising Musculoskeletal:negative for  back pain and muscle weakness Neurological: negative for dizziness, headaches, vertigo and weakness Endocrine: negative for diabetic symptoms including polydipsia, polyuria and skin dryness Allergic/Immunologic: negative for hay fever and urticaria    Objective:    BP 113/77 (BP Location: Left Arm, Patient Position: Sitting, Cuff Size: Normal)   Pulse 80   Ht 5\' 4"  (1.626 m)   Wt 154 lb 11.2 oz (70.2 kg)   LMP 09/29/2016    Breastfeeding? No   BMI 26.55 kg/m .  Body mass index is 26.55 kg/m.     General Appearance:    Alert, cooperative, no distress, appears stated age, overweight  Head:    Normocephalic, without obvious abnormality, atraumatic  Eyes:    PERRL, conjunctiva/corneas clear, EOM's intact, both eyes  Ears:    Normal external ear canals, both ears  Nose:   Nares normal, septum midline, mucosa normal, no drainage or sinus tenderness  Throat:   Lips, mucosa, and tongue normal; teeth and gums normal  Neck:   Supple, symmetrical, trachea midline, no adenopathy; thyroid: no enlargement/tenderness/nodules; no carotid bruit or JVD  Back:     Symmetric, no curvature, ROM normal, no CVA tenderness  Lungs:     Clear to auscultation bilaterally, respirations unlabored  Chest Wall:    No tenderness or deformity   Heart:    Regular rate and rhythm, S1 and S2 normal, no murmur, rub or gallop  Breast Exam:    No tenderness, masses, or nipple abnormality  Abdomen:     Soft, non-tender, bowel sounds active all four quadrants, no masses, no organomegaly.   Genitalia:    Pelvic:external genitalia normal, vagina with lesions, discharge, or tenderness, rectovaginal septum  normal. Cervix normal in appearance, no cervical motion tenderness; uterus normal size, shape, consistency, mobile; no adnexal masses or tenderness.     Rectal:    Normal external sphincter.  No hemorrhoids appreciated. Internal exam not done.   Extremities:   Extremities normal, atraumatic, no cyanosis or edema  Pulses:   2+ and symmetric all extremities  Skin:   Skin color, texture, turgor normal, no rashes or lesions  Lymph nodes:   Cervical, supraclavicular, and axillary nodes normal  Neurologic:   CNII-XII intact, normal strength, sensation and reflexes throughout    Labs:  Lab Results  Component Value Date   WBC 5.8 07/14/2015   HGB 10.1 (L) 04/27/2015   HCT 37.4 07/14/2015   MCV 87 07/14/2015   PLT 418 (H) 07/14/2015      Chemistry       Component Value Date/Time   NA 138 07/14/2015 1012   K 4.3 07/14/2015 1012   CL 101 07/14/2015 1012   CO2 22 07/14/2015 1012   BUN 12 07/14/2015 1012   CREATININE 0.81 07/14/2015 1012      Component Value Date/Time   CALCIUM 9.0 07/14/2015 1012   ALKPHOS 91 07/14/2015 1012   AST 14 07/14/2015 1012   ALT 13 07/14/2015 1012   BILITOT 0.3 07/14/2015 1012       No results found for: TSH   Assessment:   Healthy female exam.   Overweight   Plan:   Labs ordered: CBC and CMP. For future order as patient would prefer to do some other time.  Breast self exam technique reviewed and patient encouraged to perform self-exam monthly. Contraception: OCP (estrogen/progesterone). Discussed healthy lifestyle modifications.  Currently doing weight loss management with Phentermine (has lost a total of 45 lbs). Was for B12 injection today but desires  to postpone.  Is up to date with flu vaccine [facility] Employee) Pap smear up to date, next due in 2018. Follow up in 1 year, or sooner as needed.     Hildred Laser, MD [facility]

## 2016-09-29 NOTE — Patient Instructions (Signed)
 Preventive Care 18-39 Years, Female Preventive care refers to lifestyle choices and visits with your health care provider that can promote health and wellness. What does preventive care include?  A yearly physical exam. This is also called an annual well check.  Dental exams once or twice a year.  Routine eye exams. Ask your health care provider how often you should have your eyes checked.  Personal lifestyle choices, including:  Daily care of your teeth and gums.  Regular physical activity.  Eating a healthy diet.  Avoiding tobacco and drug use.  Limiting alcohol use.  Practicing safe sex.  Taking vitamin and mineral supplements as recommended by your health care provider. What happens during an annual well check? The services and screenings done by your health care provider during your annual well check will depend on your age, overall health, lifestyle risk factors, and family history of disease. Counseling  Your health care provider may ask you questions about your:  Alcohol use.  Tobacco use.  Drug use.  Emotional well-being.  Home and relationship well-being.  Sexual activity.  Eating habits.  Work and work environment.  Method of birth control.  Menstrual cycle.  Pregnancy history. Screening  You may have the following tests or measurements:  Height, weight, and BMI.  Diabetes screening. This is done by checking your blood sugar (glucose) after you have not eaten for a while (fasting).  Blood pressure.  Lipid and cholesterol levels. These may be checked every 5 years starting at age 20.  Skin check.  Hepatitis C blood test.  Hepatitis B blood test.  Sexually transmitted disease (STD) testing.  BRCA-related cancer screening. This may be done if you have a family history of breast, ovarian, tubal, or peritoneal cancers.  Pelvic exam and Pap test. This may be done every 3 years starting at age 21. Starting at age 30, this may be done  every 5 years if you have a Pap test in combination with an HPV test. Discuss your test results, treatment options, and if necessary, the need for more tests with your health care provider. Vaccines  Your health care provider may recommend certain vaccines, such as:  Influenza vaccine. This is recommended every year.  Tetanus, diphtheria, and acellular pertussis (Tdap, Td) vaccine. You may need a Td booster every 10 years.  Varicella vaccine. You may need this if you have not been vaccinated.  HPV vaccine. If you are 26 or younger, you may need three doses over 6 months.  Measles, mumps, and rubella (MMR) vaccine. You may need at least one dose of MMR. You may also need a second dose.  Pneumococcal 13-valent conjugate (PCV13) vaccine. You may need this if you have certain conditions and were not previously vaccinated.  Pneumococcal polysaccharide (PPSV23) vaccine. You may need one or two doses if you smoke cigarettes or if you have certain conditions.  Meningococcal vaccine. One dose is recommended if you are age 19-21 years and a first-year college student living in a residence hall, or if you have one of several medical conditions. You may also need additional booster doses.  Hepatitis A vaccine. You may need this if you have certain conditions or if you travel or work in places where you may be exposed to hepatitis A.  Hepatitis B vaccine. You may need this if you have certain conditions or if you travel or work in places where you may be exposed to hepatitis B.  Haemophilus influenzae type b (Hib) vaccine. You may need   this if you have certain risk factors. Talk to your health care provider about which screenings and vaccines you need and how often you need them. This information is not intended to replace advice given to you by your health care provider. Make sure you discuss any questions you have with your health care provider. Document Released: 12/27/2001 Document Revised:  07/20/2016 Document Reviewed: 09/01/2015 Elsevier Interactive Patient Education  2017 Elsevier Inc.  

## 2016-09-29 NOTE — Telephone Encounter (Signed)
Called pt no answer. Lm for pt informing her that if she is a Sunday starter she needs to continue taking one pill per day until Sunday then begin a new pack.

## 2016-10-24 ENCOUNTER — Other Ambulatory Visit: Payer: Self-pay | Admitting: Obstetrics and Gynecology

## 2016-10-24 DIAGNOSIS — E663 Overweight: Secondary | ICD-10-CM

## 2017-02-15 ENCOUNTER — Other Ambulatory Visit: Payer: Self-pay | Admitting: Obstetrics and Gynecology

## 2017-02-15 DIAGNOSIS — Z3041 Encounter for surveillance of contraceptive pills: Secondary | ICD-10-CM

## 2017-04-11 ENCOUNTER — Encounter: Payer: Self-pay | Admitting: Physician Assistant

## 2017-04-11 ENCOUNTER — Ambulatory Visit: Payer: Self-pay | Admitting: Physician Assistant

## 2017-04-11 VITALS — BP 100/80 | HR 96 | Temp 98.4°F

## 2017-04-11 DIAGNOSIS — J301 Allergic rhinitis due to pollen: Secondary | ICD-10-CM

## 2017-04-11 MED ORDER — FLUTICASONE PROPIONATE 50 MCG/ACT NA SUSP: 2.0000 | Freq: Every day | NASAL | 6 refills | Status: DC

## 2017-04-11 MED ORDER — PREDNISONE 10 MG PO TABS: 30.0000 mg | ORAL_TABLET | Freq: Every day | ORAL | 0 refills | Status: DC

## 2017-04-11 NOTE — Progress Notes (Signed)
S: c/o runny nose, congestion, watery eyes, some sinus pressure, sx for about 2-3, started after being outside in the yard all day, denies fever/chills/body aches, cough, cp/sob, or v/d  O: vitals wnl, nad, perrl eomi, conjunctiva wnl, tms dull, nasal mucosa swollen and boggy, throat wnl, neck supple no lymph, lungs c t a, cv rrr  A: acute seasonal allergies  P: saline nasal rinse, flonase, pred 30mg  qd x 3d, switch allergy med to a different otc one since has been on zyrtec for years

## 2017-04-21 DIAGNOSIS — L501 Idiopathic urticaria: Secondary | ICD-10-CM | POA: Diagnosis not present

## 2017-04-21 DIAGNOSIS — L503 Dermatographic urticaria: Secondary | ICD-10-CM | POA: Diagnosis not present

## 2017-10-03 ENCOUNTER — Encounter: Payer: Self-pay | Admitting: Obstetrics and Gynecology

## 2017-10-03 ENCOUNTER — Ambulatory Visit (INDEPENDENT_AMBULATORY_CARE_PROVIDER_SITE_OTHER): Payer: 59 | Admitting: Obstetrics and Gynecology

## 2017-10-03 VITALS — BP 108/70 | HR 67 | Ht 64.0 in | Wt 170.1 lb

## 2017-10-03 DIAGNOSIS — Z8639 Personal history of other endocrine, nutritional and metabolic disease: Secondary | ICD-10-CM | POA: Diagnosis not present

## 2017-10-03 DIAGNOSIS — E663 Overweight: Secondary | ICD-10-CM | POA: Diagnosis not present

## 2017-10-03 DIAGNOSIS — Z124 Encounter for screening for malignant neoplasm of cervix: Secondary | ICD-10-CM

## 2017-10-03 DIAGNOSIS — Z01419 Encounter for gynecological examination (general) (routine) without abnormal findings: Secondary | ICD-10-CM

## 2017-10-03 NOTE — Patient Instructions (Addendum)

## 2017-10-03 NOTE — Progress Notes (Signed)
GYNECOLOGY ANNUAL EXAM CLINIC PROGRESS NOTE  Subjective:    Samantha Blackwell is a 31 y.o. 601P1001 female who presents for an annual exam. The patient has no complaints today. The patient is sexually active, married. The patient wears seatbelts: yes. The patient participates in regular exercise: no. Has the patient ever been transfused or tattooed?: no. The patient reports that there is not domestic violence in her life.   Menstrual History: Menarche age: [age] Patient's last menstrual period was 09/01/2017.  Menses are regular, light to moderate flow, lasting 4-5 days. Moderate dysmenorrhea Denies h/o STIs.  Last pap: approximate date 02/2014 and was normal. Denies h/o abnormal pap smears Contraception: Patient just discontinued OCPs last month.  Is planning on conceiving soon.    OB History  Gravida Para Term Preterm AB Living  1 1 1     1   SAB TAB Ectopic Multiple Live Births        0 1    # Outcome Date GA Lbr Len/2nd Weight Sex Delivery Anes PTL Lv  1 Term 04/26/15 9695w4d 03:01 / 00:51 7 lb 5.8 oz (3.34 kg) M Vag-Spont None  LIV      Past Medical History:  Diagnosis Date  . Dysmenorrhea   . GERD (gastroesophageal reflux disease)   . History of kidney stones 05/2015  . History of ovarian cyst     Family History  Problem Relation Age of Onset  . Cancer Maternal Grandmother        Breast cancer(reoccurence in contralateral breast in aug 2018) , ovarian cancer (sept 2018)  . Breast cancer Maternal Aunt     Past Surgical History:  Procedure Laterality Date  . NO PAST SURGERIES       Social History   Socioeconomic History  . Marital status: Married    Spouse name: Not on file  . Number of children: Not on file  . Years of education: Not on file  . Highest education level: Not on file  Social Needs  . Financial resource strain: Not on file  . Food insecurity - worry: Not on file  . Food insecurity - inability: Not on file  . Transportation needs - medical: Not on  file  . Transportation needs - non-medical: Not on file  Occupational History  . Not on file  Tobacco Use  . Smoking status: Never Smoker  . Smokeless tobacco: Never Used  Substance and Sexual Activity  . Alcohol use: No    Alcohol/week: 0.0 oz  . Drug use: No  . Sexual activity: Yes    Birth control/protection: None  Other Topics Concern  . Not on file  Social History Narrative  . Not on file    Current Outpatient Medications on File Prior to Visit  Medication Sig Dispense Refill  . Cholecalciferol (VITAMIN D3) 5000 units CAPS Take 1 capsule (5,000 Units total) by mouth daily. 30 capsule   . fluticasone (FLONASE) 50 MCG/ACT nasal spray Place 2 sprays into both nostrils daily. 16 g 6  . lansoprazole (PREVACID) 30 MG capsule TAKE 1 CAPSULE BY MOUTH ONCE DAILY 30 capsule 12   No current facility-administered medications on file prior to visit.     Allergies  Allergen Reactions  . Penicillins Rash     Review of Systems Constitutional: negative for chills, fatigue, fevers and sweats Eyes: negative for irritation, redness and visual disturbance Ears, nose, mouth, throat, and face: negative for hearing loss, nasal congestion, snoring and tinnitus Respiratory: negative for asthma,  cough, sputum Cardiovascular: negative for chest pain, dyspnea, exertional chest pressure/discomfort, irregular heart beat, palpitations and syncope Gastrointestinal: negative for abdominal pain, change in bowel habits, nausea and vomiting Genitourinary: negative for abnormal menstrual periods, genital lesions, sexual problems and vaginal discharge, dysuria and urinary incontinence Integument/breast: negative for breast lump, breast tenderness and nipple discharge Hematologic/lymphatic: negative for bleeding and easy bruising Musculoskeletal:negative for back pain and muscle weakness Neurological: negative for dizziness, headaches, vertigo and weakness Endocrine: negative for diabetic symptoms  including polydipsia, polyuria and skin dryness Allergic/Immunologic: negative for hay fever and urticaria    Objective:    BP 108/70 (BP Location: Left Arm, Patient Position: Sitting, Cuff Size: Normal)   Pulse 67   Ht 5\' 4"  (1.626 m)   Wt 170 lb 1.6 oz (77.2 kg)   LMP 09/01/2017   BMI 29.20 kg/m .  Body mass index is 29.2 kg/m.    General Appearance:    Alert, cooperative, no distress, appears stated age, overweight  Head:    Normocephalic, without obvious abnormality, atraumatic  Eyes:    PERRL, conjunctiva/corneas clear, EOM's intact, both eyes  Ears:    Normal external ear canals, both ears  Nose:   Nares normal, septum midline, mucosa normal, no drainage or sinus tenderness  Throat:   Lips, mucosa, and tongue normal; teeth and gums normal  Neck:   Supple, symmetrical, trachea midline, no adenopathy; thyroid: no enlargement/tenderness/nodules; no carotid bruit or JVD  Back:     Symmetric, no curvature, ROM normal, no CVA tenderness  Lungs:     Clear to auscultation bilaterally, respirations unlabored  Chest Wall:    No tenderness or deformity   Heart:    Regular rate and rhythm, S1 and S2 normal, no murmur, rub or gallop  Breast Exam:    No tenderness, masses, or nipple abnormality  Abdomen:     Soft, non-tender, bowel sounds active all four quadrants, no masses, no organomegaly.   Genitalia:    Pelvic:external genitalia normal, vagina with lesions, discharge, or tenderness, rectovaginal septum  normal. Scant dark red blood in vaginal vault. Cervix normal in appearance, no cervical motion tenderness; uterus normal size, shape, consistency, mobile; no adnexal masses or tenderness.     Rectal:    Normal external sphincter.  No hemorrhoids appreciated. Internal exam not done.   Extremities:   Extremities normal, atraumatic, no cyanosis or edema  Pulses:   2+ and symmetric all extremities  Skin:   Skin color, texture, turgor normal, no rashes or lesions  Lymph nodes:   Cervical,  supraclavicular, and axillary nodes normal  Neurologic:   CNII-XII intact, normal strength, sensation and reflexes throughout    Labs:  Lab Results  Component Value Date   WBC 5.8 07/14/2015   HGB 12.4 07/14/2015   HCT 37.4 07/14/2015   MCV 87 07/14/2015   PLT 418 (H) 07/14/2015      Chemistry      Component Value Date/Time   NA 138 07/14/2015 1012   K 4.3 07/14/2015 1012   CL 101 07/14/2015 1012   CO2 22 07/14/2015 1012   BUN 12 07/14/2015 1012   CREATININE 0.81 07/14/2015 1012      Component Value Date/Time   CALCIUM 9.0 07/14/2015 1012   ALKPHOS 91 07/14/2015 1012   AST 14 07/14/2015 1012   ALT 13 07/14/2015 1012   BILITOT 0.3 07/14/2015 1012       No results found for: TSH   Assessment:   Healthy female exam.  Overweight H/o vitamin D deficiency  Plan:   Labs ordered: CBC, CMP, and Vitamin D. For future order as patient would prefer to do some other time.  Breast self exam technique reviewed and patient encouraged to perform self-exam monthly. Contraception: none.  Patient desiring to conceive.  Discontinued OCPs last month. Advised on starting a PNV.  Discussed healthy lifestyle modifications.   Is up to date with flu vaccine [facility]) Pap smear performed today. Continue routine screening. Follow up in 1 year, or sooner as needed.     Hildred Laser, MD [facility]   Hildred Laser, MD [facility] 10/03/2017 8:45 AM

## 2017-10-07 LAB — PAP IG, CT-NG, RFX HPV ASCU
CHLAMYDIA, NUC. ACID AMP: NEGATIVE
Gonococcus by Nucleic Acid Amp: NEGATIVE
PAP Smear Comment: 0

## 2017-10-24 ENCOUNTER — Other Ambulatory Visit: Payer: Self-pay | Admitting: Obstetrics and Gynecology

## 2017-11-14 NOTE — L&D Delivery Note (Signed)
Delivery Summary for Samantha Blackwell  Labor Events:   Preterm labor:   Rupture date:   Rupture time:   Rupture type:   Fluid Color:   Induction:   Augmentation:   Complications:   Cervical ripening:          Delivery:   Episiotomy:   Lacerations:   Repair suture:   Repair # of packets:   Blood loss (ml): 710   Information for the patient's newborn:  Marvis, Bakken [[id number]]    Delivery 08/29/2018 11:18 PM by  Vaginal, Vacuum (Extractor) Sex:  female Gestational Age: [redacted]w[redacted]d Delivery Clinician:   Living?:         APGARS  One minute Five minutes Ten minutes  Skin color:        Heart rate:        Grimace:        Muscle tone:        Breathing:        Totals: 8  9      Presentation/position:      Resuscitation:   Cord information:    Disposition of cord blood:     Blood gases sent?  Complications:   Placenta: Delivered:       appearance Newborn Measurements: Weight: 8 lb 3.9 oz (3740 g)  Height: 20.08"  Head circumference:    Chest circumference:    Other providers:    Additional  information: Forceps:   Vacuum:   Breech:   Observed anomalies       Operative Delivery Note At 11:18 PM a viable and healthy female was delivered via Vaginal, Vacuum Financial risk analyst).  Presentation: vertex; Position: Left,, Occiput,, Anterior; Station: +2.  Vacuum placed for arrest of descent and moderate maternal pushing effort with epidural.  Patient delivered after 3 pushes using the vacuum.  Verbal consent: obtained from patient.  Risks and benefits discussed in detail.  Risks include, but are not limited to the risks of anesthesia, bleeding, infection, damage to maternal tissues, fetal cephalhematoma.  There is also the risk of inability to effect vaginal delivery of the head, or shoulder dystocia that cannot be resolved by established maneuvers, leading to the need for emergency cesarean section.  APGAR: 8, 9; weight 8 lb 3.9 oz (3740 g).   Placenta status: spontaneously  removed, intact.   Cord: 3-vessel with the following complications: none.  Cord pH: not obtained. Delayed cord clamping was observed for greater than 1 minute.   Anesthesia: Epidural Instruments: Kiwi Bell Vacuum Episiotomy: None Lacerations: 1st degree;Vaginal (ruptured vaginal hematoma while pushing) Suture Repair: 3.0 Vicryl Est. Blood Loss (mL): 710  Mom to postpartum.  Baby to Couplet care / Skin to Skin.  Hildred Laser, MD 08/29/2018, 11:50 PM

## 2018-01-11 ENCOUNTER — Encounter: Payer: Self-pay | Admitting: Obstetrics and Gynecology

## 2018-01-11 ENCOUNTER — Telehealth: Payer: Self-pay | Admitting: Obstetrics and Gynecology

## 2018-01-11 ENCOUNTER — Ambulatory Visit (INDEPENDENT_AMBULATORY_CARE_PROVIDER_SITE_OTHER): Payer: 59 | Admitting: Obstetrics and Gynecology

## 2018-01-11 VITALS — BP 107/68 | HR 68 | Wt 171.8 lb

## 2018-01-11 DIAGNOSIS — O99611 Diseases of the digestive system complicating pregnancy, first trimester: Secondary | ICD-10-CM | POA: Diagnosis not present

## 2018-01-11 DIAGNOSIS — N912 Amenorrhea, unspecified: Secondary | ICD-10-CM

## 2018-01-11 DIAGNOSIS — K219 Gastro-esophageal reflux disease without esophagitis: Secondary | ICD-10-CM | POA: Diagnosis not present

## 2018-01-11 DIAGNOSIS — O26851 Spotting complicating pregnancy, first trimester: Secondary | ICD-10-CM | POA: Diagnosis not present

## 2018-01-11 MED ORDER — RANITIDINE HCL 150 MG PO TABS: 150.0000 mg | ORAL_TABLET | Freq: Two times a day (BID) | ORAL | 6 refills | Status: DC

## 2018-01-11 NOTE — Telephone Encounter (Signed)
Please send [place].

## 2018-01-11 NOTE — Progress Notes (Signed)
    GYNECOLOGY CLINIC PROGRESS NOTE Subjective:    Samantha Blackwell is a 32 y.o. female who presents for evaluation of amenorrhea. She believes she could be pregnant. Pregnancy is desired. Pregnancy was planned. Sexual Activity: single partner, contraception: OCP (estrogen/progesterone), discontinued in October. Current symptoms also include: nausea and positive home pregnancy test. Did note some vaginal spotting last week but has since resolved. Last period was normal.   Patient's last menstrual period was 11/25/2017. The following portions of the patient's history were reviewed and updated as appropriate: allergies, current medications, past family history, past medical history, past social history, past surgical history and problem list.  Review of Systems A comprehensive review of systems was negative except for: Gastrointestinal: positive for reflux symptoms.  Taking tums which are not helping.     Objective:    BP 107/68   Pulse 68   Wt 171 lb 12.8 oz (77.9 kg)   LMP 11/25/2017   BMI 29.49 kg/m  General: alert, no distress and no acute distress    Lab Review Urine HCG: positive    Assessment:    Absence of menstruation.    Vaginal spotting  Plan:   - Pregnancy Test: Positive: EDC: 09/01/2018. EGA 6.5 weeks. Briefly discussed pre-natal care options. Pregnancy, Childbirth and the Newborn book given. Encouraged well-balanced diet, plenty of rest when needed, pre-natal vitamins daily and walking for exercise. Discussed self-help for nausea, avoiding OTC medications until consulting provider or pharmacist, other than Tylenol as needed, minimal caffeine (1-2 cups daily) and avoiding alcohol. She will schedule her initial OB visit in the next month with a NOB intake in 2-3 weeks. Feel free to call with any questions.    - Ultrasound ordered for viability with h/o spotting in pregnancy Reflux not being adequately treated with Tums, will prescribe Zantac BID.       Hildred Laserherry,  Aimar Borghi, MD [facility] 01/11/2018 9:44 AM

## 2018-01-11 NOTE — Progress Notes (Signed)
Pt is here for confirmation for pregnancy test was positive.

## 2018-01-11 NOTE — Telephone Encounter (Signed)
The patient came into the office stating that she would like to have Dr. Valentino Saxonherry send in her Nausea medication, The patient would like a call back from the nurse when this has been sent in. Please advise.

## 2018-01-12 ENCOUNTER — Other Ambulatory Visit: Payer: Self-pay

## 2018-01-12 MED ORDER — DOXYLAMINE-PYRIDOXINE ER 20-20 MG PO TBCR: 1.0000 | EXTENDED_RELEASE_TABLET | Freq: Two times a day (BID) | ORAL | 3 refills | Status: DC

## 2018-01-15 NOTE — Telephone Encounter (Signed)
Pt is aware that medication was called in  

## 2018-01-26 ENCOUNTER — Ambulatory Visit: Payer: 59 | Admitting: Obstetrics and Gynecology

## 2018-01-26 ENCOUNTER — Ambulatory Visit (INDEPENDENT_AMBULATORY_CARE_PROVIDER_SITE_OTHER): Payer: 59

## 2018-01-26 VITALS — BP 106/69 | HR 73 | Ht 64.0 in | Wt 173.4 lb

## 2018-01-26 DIAGNOSIS — E559 Vitamin D deficiency, unspecified: Secondary | ICD-10-CM

## 2018-01-26 DIAGNOSIS — O26851 Spotting complicating pregnancy, first trimester: Secondary | ICD-10-CM

## 2018-01-26 DIAGNOSIS — Z3481 Encounter for supervision of other normal pregnancy, first trimester: Secondary | ICD-10-CM

## 2018-01-26 DIAGNOSIS — N912 Amenorrhea, unspecified: Secondary | ICD-10-CM | POA: Diagnosis not present

## 2018-01-26 DIAGNOSIS — Z202 Contact with and (suspected) exposure to infections with a predominantly sexual mode of transmission: Secondary | ICD-10-CM

## 2018-01-26 DIAGNOSIS — O219 Vomiting of pregnancy, unspecified: Secondary | ICD-10-CM

## 2018-01-26 NOTE — Patient Instructions (Signed)
Morning Sickness Morning sickness is when you feel sick to your stomach (nauseous) during pregnancy. You may feel sick to your stomach and throw up (vomit). You may feel sick in the morning, but you can feel this way any time of day. Some women feel very sick to their stomach and cannot stop throwing up (hyperemesis gravidarum). Follow these instructions at home:  Only take medicines as told by your doctor.  Take multivitamins as told by your doctor. Taking multivitamins before getting pregnant can stop or lessen the harshness of morning sickness.  Eat dry toast or unsalted crackers before getting out of bed.  Eat 5 to 6 small meals a day.  Eat dry and bland foods like rice and baked potatoes.  Do not drink liquids with meals. Drink between meals.  Do not eat greasy, fatty, or spicy foods.  Have someone cook for you if the smell of food causes you to feel sick or throw up.  If you feel sick to your stomach after taking prenatal vitamins, take them at night or with a snack.  Eat protein when you need a snack (nuts, yogurt, cheese).  Eat unsweetened gelatins for dessert.  Wear a bracelet used for sea sickness (acupressure wristband).  Go to a doctor that puts thin needles into certain body points (acupuncture) to improve how you feel.  Do not smoke.  Use a humidifier to keep the air in your house free of odors.  Get lots of fresh air. Contact a doctor if:  You need medicine to feel better.  You feel dizzy or lightheaded.  You are losing weight. Get help right away if:  You feel very sick to your stomach and cannot stop throwing up.  You pass out (faint). This information is not intended to replace advice given to you by your health care provider. Make sure you discuss any questions you have with your health care provider. Document Released: 12/08/2004 Document Revised: 04/07/2016 Document Reviewed: 04/17/2013 Elsevier Interactive Patient Education  2017 [employer] How a Baby Grows During Pregnancy Pregnancy begins when a female's sperm enters a female's egg (fertilization). This happens in one of the tubes (fallopian tubes) that connect the ovaries to the womb (uterus). The fertilized egg is called an embryo until it reaches 10 weeks. From 10 weeks until birth, it is called a fetus. The fertilized egg moves down the fallopian tube to the uterus. Then it implants into the lining of the uterus and begins to grow. The developing fetus receives oxygen and nutrients through the pregnant woman's bloodstream and the tissues that grow (placenta) to support the fetus. The placenta is the life support system for the fetus. It provides nutrition and removes waste. Learning as much as you can about your pregnancy and how your baby is developing can help you enjoy the experience. It can also make you aware of when there might be a problem and when to ask questions. How long does a typical pregnancy last? A pregnancy usually lasts 280 days, or about 40 weeks. Pregnancy is divided into three trimesters:  First trimester: 0-13 weeks.  Second trimester: 14-27 weeks.  Third trimester: 28-40 weeks.  The day when your baby is considered ready to be born (full term) is your estimated date of delivery. How does my baby develop month by month? First month  The fertilized egg attaches to the inside of the uterus.  Some cells will form the placenta. Others will form the fetus.  The arms, legs, brain, spinal   cord, lungs, and heart begin to develop.  At the end of the first month, the heart begins to beat.  Second month  The bones, inner ear, eyelids, hands, and feet form.  The genitals develop.  By the end of 8 weeks, all major organs are developing.  Third month  All of the internal organs are forming.  Teeth develop below the gums.  Bones and muscles begin to grow. The spine can flex.  The skin is transparent.  Fingernails and toenails begin to  form.  Arms and legs continue to grow longer, and hands and feet develop.  The fetus is about 3 in (7.6 cm) long.  Fourth month  The placenta is completely formed.  The external sex organs, neck, outer ear, eyebrows, eyelids, and fingernails are formed.  The fetus can hear, swallow, and move its arms and legs.  The kidneys begin to produce urine.  The skin is covered with a white waxy coating (vernix) and very fine hair (lanugo).  Fifth month  The fetus moves around more and can be felt for the first time (quickening).  The fetus starts to sleep and wake up and may begin to suck its finger.  The nails grow to the end of the fingers.  The organ in the digestive system that makes bile (gallbladder) functions and helps to digest the nutrients.  If your baby is a girl, eggs are present in her ovaries. If your baby is a boy, testicles start to move down into his scrotum.  Sixth month  The lungs are formed, but the fetus is not yet able to breathe.  The eyes open. The brain continues to develop.  Your baby has fingerprints and toe prints. Your baby's hair grows thicker.  At the end of the second trimester, the fetus is about 9 in (22.9 cm) long.  Seventh month  The fetus kicks and stretches.  The eyes are developed enough to sense changes in light.  The hands can make a grasping motion.  The fetus responds to sound.  Eighth month  All organs and body systems are fully developed and functioning.  Bones harden and taste buds develop. The fetus may hiccup.  Certain areas of the brain are still developing. The skull remains soft.  Ninth month  The fetus gains about  lb (0.23 kg) each week.  The lungs are fully developed.  Patterns of sleep develop.  The fetus's head typically moves into a head-down position (vertex) in the uterus to prepare for birth. If the buttocks move into a vertex position instead, the baby is breech.  The fetus weighs 6-9 lbs  (2.72-4.08 kg) and is 19-20 in (48.26-50.8 cm) long.  What can I do to have a healthy pregnancy and help my baby develop? Eating and Drinking  Eat a healthy diet. ? Talk with your health care provider to make sure that you are getting the nutrients that you and your baby need. ? Visit www.choosemyplate.gov to learn about creating a healthy diet.  Gain a healthy amount of weight during pregnancy as advised by your health care provider. This is usually 25-35 pounds. You may need to: ? Gain more if you were underweight before getting pregnant or if you are pregnant with more than one baby. ? Gain less if you were overweight or obese when you got pregnant.  Medicines and Vitamins  Take prenatal vitamins as directed by your health care provider. These include vitamins such as folic acid, iron, calcium, and vitamin D.   They are important for healthy development.  Take medicines only as directed by your health care provider. Read labels and ask a pharmacist or your health care provider whether over-the-counter medicines, supplements, and prescription drugs are safe to take during pregnancy.  Activities  Be physically active as advised by your health care provider. Ask your health care provider to recommend activities that are safe for you to do, such as walking or swimming.  Do not participate in strenuous or extreme sports.  Lifestyle  Do not drink alcohol.  Do not use any tobacco products, including cigarettes, chewing tobacco, or electronic cigarettes. If you need help quitting, ask your health care provider.  Do not use illegal drugs.  Safety  Avoid exposure to mercury, lead, or other heavy metals. Ask your health care provider about common sources of these heavy metals.  Avoid listeria infection during pregnancy. Follow these precautions: ? Do not eat soft cheeses or deli meats. ? Do not eat hot dogs unless they have been warmed up to the point of steaming, such as in the  microwave oven. ? Do not drink unpasteurized milk.  Avoid toxoplasmosis infection during pregnancy. Follow these precautions: ? Do not change your cat's litter box, if you have a cat. Ask someone else to do this for you. ? Wear gardening gloves while working in the yard.  General Instructions  Keep all follow-up visits as directed by your health care provider. This is important. This includes prenatal care and screening tests.  Manage any chronic health conditions. Work closely with your health care provider to keep conditions, such as diabetes, under control.  How do I know if my baby is developing well? At each prenatal visit, your health care provider will do several different tests to check on your health and keep track of your baby's development. These include:  Fundal height. ? Your health care provider will measure your growing belly from top to bottom using a tape measure. ? Your health care provider will also feel your belly to determine your baby's position.  Heartbeat. ? An ultrasound in the first trimester can confirm pregnancy and show a heartbeat, depending on how far along you are. ? Your health care provider will check your baby's heart rate at every prenatal visit. ? As you get closer to your delivery date, you may have regular fetal heart rate monitoring to make sure that your baby is not in distress.  Second trimester ultrasound. ? This ultrasound checks your baby's development. It also indicates your baby's gender.  What should I do if I have concerns about my baby's development? Always talk with your health care provider about any concerns that you may have. This information is not intended to replace advice given to you by your health care provider. Make sure you discuss any questions you have with your health care provider. Document Released: 04/18/2008 Document Revised: 04/07/2016 Document Reviewed: 04/09/2014 Elsevier Interactive Patient Education  2018  [employer] First Trimester of Pregnancy The first trimester of pregnancy is from week 1 until the end of week 13 (months 1 through 3). A week after a sperm fertilizes an egg, the egg will implant on the wall of the uterus. This embryo will begin to develop into a baby. Genes from you and your partner will form the baby. The female genes will determine whether the baby will be a boy or a girl. At 6-8 weeks, the eyes and face will be formed, and the heartbeat can be seen on ultrasound.   At the end of 12 weeks, all the baby's organs will be formed. Now that you are pregnant, you will want to do everything you can to have a healthy baby. Two of the most important things are to get good prenatal care and to follow your health care provider's instructions. Prenatal care is all the medical care you receive before the baby's birth. This care will help prevent, find, and treat any problems during the pregnancy and childbirth. Body changes during your first trimester Your body goes through many changes during pregnancy. The changes vary from woman to woman.  You may gain or lose a couple of pounds at first.  You may feel sick to your stomach (nauseous) and you may throw up (vomit). If the vomiting is uncontrollable, call your health care provider.  You may tire easily.  You may develop headaches that can be relieved by medicines. All medicines should be approved by your health care provider.  You may urinate more often. Painful urination may mean you have a bladder infection.  You may develop heartburn as a result of your pregnancy.  You may develop constipation because certain hormones are causing the muscles that push stool through your intestines to slow down.  You may develop hemorrhoids or swollen veins (varicose veins).  Your breasts may begin to grow larger and become tender. Your nipples may stick out more, and the tissue that surrounds them (areola) may become darker.  Your gums may bleed and  may be sensitive to brushing and flossing.  Dark spots or blotches (chloasma, mask of pregnancy) may develop on your face. This will likely fade after the baby is born.  Your menstrual periods will stop.  You may have a loss of appetite.  You may develop cravings for certain kinds of food.  You may have changes in your emotions from day to day, such as being excited to be pregnant or being concerned that something may go wrong with the pregnancy and baby.  You may have more vivid and strange dreams.  You may have changes in your hair. These can include thickening of your hair, rapid growth, and changes in texture. Some women also have hair loss during or after pregnancy, or hair that feels dry or thin. Your hair will most likely return to normal after your baby is born.  What to expect at prenatal visits During a routine prenatal visit:  You will be weighed to make sure you and the baby are growing normally.  Your blood pressure will be taken.  Your abdomen will be measured to track your baby's growth.  The fetal heartbeat will be listened to between weeks 10 and 14 of your pregnancy.  Test results from any previous visits will be discussed.  Your health care provider may ask you:  How you are feeling.  If you are feeling the baby move.  If you have had any abnormal symptoms, such as leaking fluid, bleeding, severe headaches, or abdominal cramping.  If you are using any tobacco products, including cigarettes, chewing tobacco, and electronic cigarettes.  If you have any questions.  Other tests that may be performed during your first trimester include:  Blood tests to find your blood type and to check for the presence of any previous infections. The tests will also be used to check for low iron levels (anemia) and protein on red blood cells (Rh antibodies). Depending on your risk factors, or if you previously had diabetes during pregnancy, you may have tests to   check for high  blood sugar that affects pregnant women (gestational diabetes).  Urine tests to check for infections, diabetes, or protein in the urine.  An ultrasound to confirm the proper growth and development of the baby.  Fetal screens for spinal cord problems (spina bifida) and Down syndrome.  HIV (human immunodeficiency virus) testing. Routine prenatal testing includes screening for HIV, unless you choose not to have this test.  You may need other tests to make sure you and the baby are doing well.  Follow these instructions at home: Medicines  Follow your health care provider's instructions regarding medicine use. Specific medicines may be either safe or unsafe to take during pregnancy.  Take a prenatal vitamin that contains at least 600 micrograms (mcg) of folic acid.  If you develop constipation, try taking a stool softener if your health care provider approves. Eating and drinking  Eat a balanced diet that includes fresh fruits and vegetables, whole grains, good sources of protein such as meat, eggs, or tofu, and low-fat dairy. Your health care provider will help you determine the amount of weight gain that is right for you.  Avoid raw meat and uncooked cheese. These carry germs that can cause birth defects in the baby.  Eating four or five small meals rather than three large meals a day may help relieve nausea and vomiting. If you start to feel nauseous, eating a few soda crackers can be helpful. Drinking liquids between meals, instead of during meals, also seems to help ease nausea and vomiting.  Limit foods that are high in fat and processed sugars, such as fried and sweet foods.  To prevent constipation: ? Eat foods that are high in fiber, such as fresh fruits and vegetables, whole grains, and beans. ? Drink enough fluid to keep your urine clear or pale yellow. Activity  Exercise only as directed by your health care provider. Most women can continue their usual exercise routine  during pregnancy. Try to exercise for 30 minutes at least 5 days a week. Exercising will help you: ? Control your weight. ? Stay in shape. ? Be prepared for labor and delivery.  Experiencing pain or cramping in the lower abdomen or lower back is a good sign that you should stop exercising. Check with your health care provider before continuing with normal exercises.  Try to avoid standing for long periods of time. Move your legs often if you must stand in one place for a long time.  Avoid heavy lifting.  Wear low-heeled shoes and practice good posture.  You may continue to have sex unless your health care provider tells you not to. Relieving pain and discomfort  Wear a good support bra to relieve breast tenderness.  Take warm sitz baths to soothe any pain or discomfort caused by hemorrhoids. Use hemorrhoid cream if your health care provider approves.  Rest with your legs elevated if you have leg cramps or low back pain.  If you develop varicose veins in your legs, wear support hose. Elevate your feet for 15 minutes, 3-4 times a day. Limit salt in your diet. Prenatal care  Schedule your prenatal visits by the twelfth week of pregnancy. They are usually scheduled monthly at first, then more often in the last 2 months before delivery.  Write down your questions. Take them to your prenatal visits.  Keep all your prenatal visits as told by your health care provider. This is important. Safety  Wear your seat belt at all times when driving.  Make   a list of emergency phone numbers, including numbers for family, friends, the hospital, and police and fire departments. General instructions  Ask your health care provider for a referral to a local prenatal education class. Begin classes no later than the beginning of month 6 of your pregnancy.  Ask for help if you have counseling or nutritional needs during pregnancy. Your health care provider can offer advice or refer you to specialists  for help with various needs.  Do not use hot tubs, steam rooms, or saunas.  Do not douche or use tampons or scented sanitary pads.  Do not cross your legs for long periods of time.  Avoid cat litter boxes and soil used by cats. These carry germs that can cause birth defects in the baby and possibly loss of the fetus by miscarriage or stillbirth.  Avoid all smoking, herbs, alcohol, and medicines not prescribed by your health care provider. Chemicals in these products affect the formation and growth of the baby.  Do not use any products that contain nicotine or tobacco, such as cigarettes and e-cigarettes. If you need help quitting, ask your health care provider. You may receive counseling support and other resources to help you quit.  Schedule a dentist appointment. At home, brush your teeth with a soft toothbrush and be gentle when you floss. Contact a health care provider if:  You have dizziness.  You have mild pelvic cramps, pelvic pressure, or nagging pain in the abdominal area.  You have persistent nausea, vomiting, or diarrhea.  You have a bad smelling vaginal discharge.  You have pain when you urinate.  You notice increased swelling in your face, hands, legs, or ankles.  You are exposed to fifth disease or chickenpox.  You are exposed to German measles (rubella) and have never had it. Get help right away if:  You have a fever.  You are leaking fluid from your vagina.  You have spotting or bleeding from your vagina.  You have severe abdominal cramping or pain.  You have rapid weight gain or loss.  You vomit blood or material that looks like coffee grounds.  You develop a severe headache.  You have shortness of breath.  You have any kind of trauma, such as from a fall or a car accident. Summary  The first trimester of pregnancy is from week 1 until the end of week 13 (months 1 through 3).  Your body goes through many changes during pregnancy. The changes  vary from woman to woman.  You will have routine prenatal visits. During those visits, your health care provider will examine you, discuss any test results you may have, and talk with you about how you are feeling. This information is not intended to replace advice given to you by your health care provider. Make sure you discuss any questions you have with your health care provider. Document Released: 10/25/2001 Document Revised: 10/12/2016 Document Reviewed: 10/12/2016 Elsevier Interactive Patient Education  2018 [employer] Commonly Asked Questions During Pregnancy  Cats: A parasite can be excreted in cat feces.  To avoid exposure you need to have another person empty the little box.  If you must empty the litter box you will need to wear gloves.  Wash your hands after handling your cat.  This parasite can also be found in raw or undercooked meat so this should also be avoided.  Colds, Sore Throats, Flu: Please check your medication sheet to see what you can take for symptoms.  If your symptoms are   unrelieved by these medications please call the office.  Dental Work: Most any dental work your dentist recommends is permitted.  X-rays should only be taken during the first trimester if absolutely necessary.  Your abdomen should be shielded with a lead apron during all x-rays.  Please notify your provider prior to receiving any x-rays.  Novocaine is fine; gas is not recommended.  If your dentist requires a note from us prior to dental work please call the office and we will provide one for you.  Exercise: Exercise is an important part of staying healthy during your pregnancy.  You may continue most exercises you were accustomed to prior to pregnancy.  Later in your pregnancy you will most likely notice you have difficulty with activities requiring balance like riding a bicycle.  It is important that you listen to your body and avoid activities that put you at a higher risk of falling.  Adequate rest  and staying well hydrated are a must!  If you have questions about the safety of specific activities ask your provider.    Exposure to Children with illness: Try to avoid obvious exposure; report any symptoms to us when noted,  If you have chicken pos, red measles or mumps, you should be immune to these diseases.   Please do not take any vaccines while pregnant unless you have checked with your OB provider.  Fetal Movement: After 28 weeks we recommend you do "kick counts" twice daily.  Lie or sit down in a calm quiet environment and count your baby movements "kicks".  You should feel your baby at least 10 times per hour.  If you have not felt 10 kicks within the first hour get up, walk around and have something sweet to eat or drink then repeat for an additional hour.  If count remains less than 10 per hour notify your provider.  Fumigating: Follow your pest control agent's advice as to how long to stay out of your home.  Ventilate the area well before re-entering.  Hemorrhoids:   Most over-the-counter preparations can be used during pregnancy.  Check your medication to see what is safe to use.  It is important to use a stool softener or fiber in your diet and to drink lots of liquids.  If hemorrhoids seem to be getting worse please call the office.   Hot Tubs:  Hot tubs Jacuzzis and saunas are not recommended while pregnant.  These increase your internal body temperature and should be avoided.  Intercourse:  Sexual intercourse is safe during pregnancy as long as you are comfortable, unless otherwise advised by your provider.  Spotting may occur after intercourse; report any bright red bleeding that is heavier than spotting.  Labor:  If you know that you are in labor, please go to the hospital.  If you are unsure, please call the office and let us help you decide what to do.  Lifting, straining, etc:  If your job requires heavy lifting or straining please check with your provider for any limitations.   Generally, you should not lift items heavier than that you can lift simply with your hands and arms (no back muscles)  Painting:  Paint fumes do not harm your pregnancy, but may make you ill and should be avoided if possible.  Latex or water based paints have less odor than oils.  Use adequate ventilation while painting.  Permanents & Hair Color:  Chemicals in hair dyes are not recommended as they cause increase hair dryness which   can increase hair loss during pregnancy.  " Highlighting" and permanents are allowed.  Dye may be absorbed differently and permanents may not hold as well during pregnancy.  Sunbathing:  Use a sunscreen, as skin burns easily during pregnancy.  Drink plenty of fluids; avoid over heating.  Tanning Beds:  Because their possible side effects are still unknown, tanning beds are not recommended.  Ultrasound Scans:  Routine ultrasounds are performed at approximately 20 weeks.  You will be able to see your baby's general anatomy an if you would like to know the gender this can usually be determined as well.  If it is questionable when you conceived you may also receive an ultrasound early in your pregnancy for dating purposes.  Otherwise ultrasound exams are not routinely performed unless there is a medical necessity.  Although you can request a scan we ask that you pay for it when conducted because insurance does not cover " patient request" scans.  Work: If your pregnancy proceeds without complications you may work until your due date, unless your physician or employer advises otherwise.  Round Ligament Pain/Pelvic Discomfort:  Sharp, shooting pains not associated with bleeding are fairly common, usually occurring in the second trimester of pregnancy.  They tend to be worse when standing up or when you remain standing for long periods of time.  These are the result of pressure of certain pelvic ligaments called "round ligaments".  Rest, Tylenol and heat seem to be the most  effective relief.  As the womb and fetus grow, they rise out of the pelvis and the discomfort improves.  Please notify the office if your pain seems different than that described.  It may represent a more serious condition.  Common Medications Safe in Pregnancy  Acne:      Constipation:  Benzoyl Peroxide     Colace  Clindamycin      Dulcolax Suppository  Topica Erythromycin     Fibercon  Salicylic Acid      Metamucil         Miralax AVOID:        Senakot   Accutane    Cough:  Retin-A       Cough Drops  Tetracycline      Phenergan w/ Codeine if Rx  Minocycline      Robitussin (Plain & DM)  Antibiotics:     Crabs/Lice:  Ceclor       RID  Cephalosporins    AVOID:  E-Mycins      Kwell  Keflex  Macrobid/Macrodantin   Diarrhea:  Penicillin      Kao-Pectate  Zithromax      Imodium AD         PUSH FLUIDS AVOID:       Cipro     Fever:  Tetracycline      Tylenol (Regular or Extra  Minocycline       Strength)  Levaquin      Extra Strength-Do not          Exceed 8 tabs/24 hrs Caffeine:        <200mg/day (equiv. To 1 cup of coffee or  approx. 3 12 oz sodas)         Gas: Cold/Hayfever:       Gas-X  Benadryl      Mylicon  Claritin       Phazyme  **Claritin-D        Chlor-Trimeton    Headaches:  Dimetapp      ASA-Free Excedrin    Drixoral-Non-Drowsy     Cold Compress  Mucinex (Guaifenasin)     Tylenol (Regular or Extra  Sudafed/Sudafed-12 Hour     Strength)  **Sudafed PE Pseudoephedrine   Tylenol Cold & Sinus     Vicks Vapor Rub  Zyrtec  **AVOID if Problems With Blood Pressure         Heartburn: Avoid lying down for at least 1 hour after meals  Aciphex      Maalox     Rash:  Milk of Magnesia     Benadryl    Mylanta       1% Hydrocortisone Cream  Pepcid  Pepcid Complete   Sleep Aids:  Prevacid      Ambien   Prilosec       Benadryl  Rolaids       Chamomile Tea  Tums (Limit 4/day)     Unisom  Zantac       Tylenol PM         Warm milk-add vanilla  or  Hemorrhoids:       Sugar for taste  Anusol/Anusol H.C.  (RX: Analapram 2.5%)  Sugar Substitutes:  Hydrocortisone OTC     Ok in moderation  Preparation H      Tucks        Vaseline lotion applied to tissue with wiping    Herpes:     Throat:  Acyclovir      Oragel  Famvir  Valtrex     Vaccines:         Flu Shot Leg Cramps:       *Gardasil  Benadryl      Hepatitis A         Hepatitis B Nasal Spray:       Pneumovax  Saline Nasal Spray     Polio Booster         Tetanus Nausea:       Tuberculosis test or PPD  Vitamin B6 25 mg TID   AVOID:    Dramamine      *Gardasil  Emetrol       Live Poliovirus  Ginger Root 250 mg QID    MMR (measles, mumps &  High Complex Carbs @ Bedtime    rebella)  Sea Bands-Accupressure    Varicella (Chickenpox)  Unisom 1/2 tab TID     *No known complications           If received before Pain:         Known pregnancy;   Darvocet       Resume series after  Lortab        Delivery  Percocet    Yeast:   Tramadol      Femstat  Tylenol 3      Gyne-lotrimin  Ultram       Monistat  Vicodin           MISC:         All Sunscreens           Hair Coloring/highlights          Insect Repellant's          (Including DEET)         Mystic Tans  

## 2018-01-26 NOTE — Progress Notes (Unsigned)
Mickle PlumbAllison G Brenning presents for NOB nurse interview visit. [facility]  [facility].  P-1001. Dating scan today shows SIUP at 8 4/7.  Pregnancy education material explained and given. 0 cats in the home. NOB labs ordered.  HIV labs and Drug screen were explained optional and she did not decline. Drug screen ordered. Pt with history Vitmain D deficiency. Vit D ordered today. PNV encouraged. Genetic testing discussed.  Genetic testing: Pt requested Panorama at NOB PE. Pt complains of N/V. Prescribed Bonjesta at last visit. Unable to purchase due to cost. Offered to send in Diclegis however pt states that it is too expensive as well. Advise pt to take B6 50 mg 3 times a day and half a tablet of Unsoim at bedtime. Encouraged hydration, small frequent meals, and preggy pops. Pt complains of itchy rash on elbows and back of neck times 1 week. No new detergents, soaps, lotions, or body wash. Advised hydrocortisone cream and benadryl prn. [facility] To follow up with provider in 4 weeks for NOB physical.  All questions answered.

## 2018-01-27 LAB — MICROSCOPIC EXAMINATION
BACTERIA UA: NONE SEEN
Casts: NONE SEEN /lpf

## 2018-01-27 LAB — CBC WITH DIFFERENTIAL/PLATELET
BASOS ABS: 0 10*3/uL (ref 0.0–0.2)
BASOS: 1 %
EOS (ABSOLUTE): 0.2 10*3/uL (ref 0.0–0.4)
EOS: 2 %
HEMOGLOBIN: 12.5 g/dL (ref 11.1–15.9)
Hematocrit: 38.1 % (ref 34.0–46.6)
IMMATURE GRANS (ABS): 0 10*3/uL (ref 0.0–0.1)
Immature Granulocytes: 0 %
LYMPHS: 27 %
Lymphocytes Absolute: 2.2 10*3/uL (ref 0.7–3.1)
MCH: 29 pg (ref 26.6–33.0)
MCHC: 32.8 g/dL (ref 31.5–35.7)
MCV: 88 fL (ref 79–97)
MONOCYTES: 6 %
Monocytes Absolute: 0.5 10*3/uL (ref 0.1–0.9)
NEUTROS ABS: 5.2 10*3/uL (ref 1.4–7.0)
Neutrophils: 64 %
Platelets: 410 10*3/uL — ABNORMAL HIGH (ref 150–379)
RBC: 4.31 x10E6/uL (ref 3.77–5.28)
RDW: 13.3 % (ref 12.3–15.4)
WBC: 8 10*3/uL (ref 3.4–10.8)

## 2018-01-27 LAB — ANTIBODY SCREEN: ANTIBODY SCREEN: NEGATIVE

## 2018-01-27 LAB — URINALYSIS, ROUTINE W REFLEX MICROSCOPIC
BILIRUBIN UA: NEGATIVE
Glucose, UA: NEGATIVE
Ketones, UA: NEGATIVE
NITRITE UA: NEGATIVE
PH UA: 7 (ref 5.0–7.5)
Protein, UA: NEGATIVE
RBC UA: NEGATIVE
SPEC GRAV UA: 1.006 (ref 1.005–1.030)
Urobilinogen, Ur: 0.2 mg/dL (ref 0.2–1.0)

## 2018-01-27 LAB — HIV ANTIBODY (ROUTINE TESTING W REFLEX): HIV Screen 4th Generation wRfx: NONREACTIVE

## 2018-01-27 LAB — ABO AND RH: Rh Factor: POSITIVE

## 2018-01-27 LAB — HEPATITIS B SURFACE ANTIGEN: HEP B S AG: NEGATIVE

## 2018-01-27 LAB — RPR: RPR Ser Ql: NONREACTIVE

## 2018-01-27 LAB — VARICELLA ZOSTER ANTIBODY, IGG

## 2018-01-27 LAB — RUBELLA SCREEN: Rubella Antibodies, IGG: 6.48 index (ref >0.99)

## 2018-01-27 LAB — VITAMIN D 25 HYDROXY (VIT D DEFICIENCY, FRACTURES): VIT D 25 HYDROXY: 51.9 ng/mL (ref 30.0–100.0)

## 2018-01-28 ENCOUNTER — Encounter: Payer: Self-pay | Admitting: Obstetrics and Gynecology

## 2018-01-28 LAB — CHLAMYDIA/GONOCOCCUS/TRICHOMONAS, NAA
Chlamydia by NAA: NEGATIVE
Gonococcus by NAA: NEGATIVE
Trich vag by NAA: NEGATIVE

## 2018-01-29 LAB — CULTURE, OB URINE

## 2018-01-29 LAB — URINE CULTURE, OB REFLEX

## 2018-01-30 ENCOUNTER — Other Ambulatory Visit: Payer: Self-pay

## 2018-01-30 LAB — MONITOR DRUG PROFILE 14(MW)
AMPHETAMINE SCREEN URINE: NEGATIVE ng/mL
BARBITURATE SCREEN URINE: NEGATIVE ng/mL
BENZODIAZEPINE SCREEN, URINE: NEGATIVE ng/mL
Buprenorphine, Urine: NEGATIVE ng/mL
CANNABINOIDS UR QL SCN: NEGATIVE ng/mL
Cocaine (Metab) Scrn, Ur: NEGATIVE ng/mL
Creatinine(Crt), U: 27.3 mg/dL (ref 20.0–300.0)
FENTANYL, URINE: NEGATIVE pg/mL
Meperidine Screen, Urine: NEGATIVE ng/mL
Methadone Screen, Urine: NEGATIVE ng/mL
OXYCODONE+OXYMORPHONE UR QL SCN: NEGATIVE ng/mL
Opiate Scrn, Ur: NEGATIVE ng/mL
PROPOXYPHENE SCREEN URINE: NEGATIVE ng/mL
Ph of Urine: 6.5 (ref 4.5–8.9)
Phencyclidine Qn, Ur: NEGATIVE ng/mL
SPECIFIC GRAVITY: 1.004
Tramadol Screen, Urine: NEGATIVE ng/mL

## 2018-01-30 LAB — NICOTINE SCREEN, URINE: COTININE UR QL SCN: NEGATIVE ng/mL

## 2018-01-30 MED ORDER — TRIAMCINOLONE 0.1 % CREAM:EUCERIN CREAM 1:1: 1.0000 "application " | TOPICAL_CREAM | Freq: Two times a day (BID) | CUTANEOUS | 1 refills | Status: DC

## 2018-02-06 ENCOUNTER — Other Ambulatory Visit: Payer: 59

## 2018-02-11 ENCOUNTER — Encounter: Payer: Self-pay | Admitting: Obstetrics and Gynecology

## 2018-02-26 ENCOUNTER — Ambulatory Visit (INDEPENDENT_AMBULATORY_CARE_PROVIDER_SITE_OTHER): Payer: 59 | Admitting: Obstetrics and Gynecology

## 2018-02-26 ENCOUNTER — Encounter: Payer: Self-pay | Admitting: Obstetrics and Gynecology

## 2018-02-26 VITALS — BP 106/70 | HR 74 | Ht 64.0 in | Wt 177.3 lb

## 2018-02-26 DIAGNOSIS — Z3491 Encounter for supervision of normal pregnancy, unspecified, first trimester: Secondary | ICD-10-CM | POA: Diagnosis not present

## 2018-02-26 LAB — POCT URINALYSIS DIPSTICK
BILIRUBIN UA: NEGATIVE
Blood, UA: NEGATIVE
Glucose, UA: NEGATIVE
KETONES UA: NEGATIVE
Leukocytes, UA: NEGATIVE
NITRITE UA: NEGATIVE
PH UA: 7.5 (ref 5.0–8.0)
PROTEIN UA: NEGATIVE
SPEC GRAV UA: 1.01 (ref 1.010–1.025)
UROBILINOGEN UA: 0.2 U/dL

## 2018-02-26 NOTE — Progress Notes (Signed)
ROB-pt wants to know about taking something for sleep and about having some dental work done. No other concerns.

## 2018-02-26 NOTE — Progress Notes (Signed)
OBSTETRIC INITIAL PRENATAL VISIT  Subjective:    Samantha Blackwell is being seen today for her first obstetrical visit.  This is a planned pregnancy. She is a G2P1001 female at [redacted]w[redacted]d gestation, Estimated Date of Delivery: 09/03/18 with last menstrual period 11/25/2017, consistent with 8 week sono. Her obstetrical history is significant for none. Relationship with FOB: spouse, living together. Patient does intend to breast feed. Pregnancy history fully reviewed.    OB History  Gravida Para Term Preterm AB Living  2 1 1  0 0 1  SAB TAB Ectopic Multiple Live Births  0 0 0 0 1    # Outcome Date GA Lbr Len/2nd Weight Sex Delivery Anes PTL Lv  2 Current           1 Term 04/26/15 [redacted]w[redacted]d 03:01 / 00:51 7 lb 5.8 oz (3.34 kg) M Vag-Spont None  LIV     Apgar1: 9  Apgar5: 9    Gynecologic History:  Last pap smear was 09/2017.  Results were normal.  Denies h/o abnormal pap smears in the past.  Denies history of STIs.    Past Medical History:  Diagnosis Date  . Dysmenorrhea   . GERD (gastroesophageal reflux disease)   . History of kidney stones 05/2015  . History of ovarian cyst      Family History  Problem Relation Age of Onset  . Cancer Maternal Grandmother        Breast cancer(reoccurence in contralateral breast in aug 2018) , ovarian cancer (sept 2018)  . Breast cancer Maternal Aunt      Past Surgical History:  Procedure Laterality Date  . NO PAST SURGERIES       Social History   Socioeconomic History  . Marital status: Married    Spouse name: Not on file  . Number of children: Not on file  . Years of education: Not on file  . Highest education level: Not on file  Occupational History  . Not on file  Social Needs  . Financial resource strain: Not on file  . Food insecurity:    Worry: Not on file    Inability: Not on file  . Transportation needs:    Medical: Not on file    Non-medical: Not on file  Tobacco Use  . Smoking status: Never Smoker  . Smokeless  tobacco: Never Used  Substance and Sexual Activity  . Alcohol use: No    Alcohol/week: 0.0 oz  . Drug use: No  . Sexual activity: Yes    Birth control/protection: None  Lifestyle  . Physical activity:    Days per week: Not on file    Minutes per session: Not on file  . Stress: Not on file  Relationships  . Social connections:    Talks on phone: Not on file    Gets together: Not on file    Attends religious service: Not on file    Active member of club or organization: Not on file    Attends meetings of clubs or organizations: Not on file    Relationship status: Not on file  . Intimate partner violence:    Fear of current or ex partner: Not on file    Emotionally abused: Not on file    Physically abused: Not on file    Forced sexual activity: Not on file  Other Topics Concern  . Not on file  Social History Narrative  . Not on file     Current Outpatient Medications on File Prior  to Visit  Medication Sig Dispense Refill  . fluticasone (FLONASE) 50 MCG/ACT nasal spray Place 2 sprays into both nostrils daily. 16 g 6  . prenatal vitamin w/FE, FA (NATACHEW) 29-1 MG CHEW chewable tablet Chew 1 tablet by mouth daily at 12 noon.    . ranitidine (ZANTAC) 150 MG tablet Take 1 tablet (150 mg total) by mouth 2 (two) times daily. 60 tablet 6  . Triamcinolone Acetonide (TRIAMCINOLONE 0.1 % CREAM : EUCERIN) CREA Apply 1 application topically 2 (two) times daily. 1 each 1   No current facility-administered medications on file prior to visit.      Allergies  Allergen Reactions  . Penicillins Rash     Review of Systems General:Not Present- Fever, Weight Loss and Weight Gain. Skin:Not Present- Rash. HEENT:Not Present- Blurred Vision, Headache and Bleeding Gums. Respiratory:Not Present- Difficulty Breathing. Breast:Not Present- Breast Mass. Cardiovascular:Not Present- Chest Pain, Elevated Blood Pressure, Fainting / Blacking Out and Shortness of Breath. Gastrointestinal:Not  Present- Abdominal Pain, Constipation, Nausea and Vomiting. Female Genitourinary:Not Present- Frequency, Painful Urination, Pelvic Pain, Vaginal Bleeding, Vaginal Discharge, Contractions, regular, Fetal Movements Decreased, Urinary Complaints and Vaginal Fluid. Musculoskeletal:Not Present- Back Pain and Leg Cramps. Neurological:Not Present- Dizziness. Psychiatric:Not Present- Depression.   Positive for sleep disturbances.     Objective:   Blood pressure 106/70, pulse 74, weight 177 lb 4.8 oz (80.4 kg), last menstrual period 11/25/2017.  Body mass index is 30.43 kg/m.   General Appearance:    Alert, cooperative, no distress, appears stated age, mildly obese  Head:    Normocephalic, without obvious abnormality, atraumatic  Eyes:    PERRL, conjunctiva/corneas clear, EOM's intact, both eyes  Ears:    Normal external ear canals, both ears  Nose:   Nares normal, septum midline, mucosa normal, no drainage or sinus tenderness  Throat:   Lips, mucosa, and tongue normal; teeth and gums normal  Neck:   Supple, symmetrical, trachea midline, no adenopathy; thyroid: no enlargement/tenderness/nodules; no carotid bruit or JVD  Back:     Symmetric, no curvature, ROM normal, no CVA tenderness  Lungs:     Clear to auscultation bilaterally, respirations unlabored  Chest Wall:    No tenderness or deformity   Heart:    Regular rate and rhythm, S1 and S2 normal, no murmur, rub or gallop  Breast Exam:    No tenderness, masses, or nipple abnormality  Abdomen:     Soft, non-tender, bowel sounds active all four quadrants, no masses, no organomegaly.  FH 13.  FHT 156 bpm.  Genitalia:    Pelvic:external genitalia normal, vagina without lesions, discharge, or tenderness, rectovaginal septum  normal. Cervix normal in appearance, no cervical motion tenderness, no adnexal masses or tenderness.  Pregnancy positive findings: uterine enlargement: 13 wk size, nontender.   Rectal:    Normal external sphincter.  No  hemorrhoids appreciated. Internal exam not done.   Extremities:   Extremities normal, atraumatic, no cyanosis or edema  Pulses:   2+ and symmetric all extremities  Skin:   Skin color, texture, turgor normal, no rashes or lesions  Lymph nodes:   Cervical, supraclavicular, and axillary nodes normal  Neurologic:   CNII-XII intact, normal strength, sensation and reflexes throughout      Assessment:    Pregnancy at 13 and 0/7 weeks   Sleep disturbances Plan:    Initial labs reviewed. Prenatal vitamins encouraged. Problem list reviewed and updated. New OB counseling:  The patient has been given an overview regarding routine prenatal care.  Recommendations regarding  diet, weight gain, and exercise in pregnancy were given.  Prenatal testing, optional genetic testing, and ultrasound use in pregnancy were reviewed.  Patient has had normal Panorama testing. Benefits of Breast Feeding were discussed. The patient is encouraged to consider nursing her baby post partum. Discussed OTC medications for sleep disturbances.  Desires letter for dental clearance. Letter given.   Follow up in 4 weeks.  50% of 30 min visit spent on counseling and coordination of care.     Hildred Laserherry, Chantil Bari, MD [facility]

## 2018-03-27 ENCOUNTER — Ambulatory Visit (INDEPENDENT_AMBULATORY_CARE_PROVIDER_SITE_OTHER): Payer: 59 | Admitting: Obstetrics and Gynecology

## 2018-03-27 ENCOUNTER — Encounter: Payer: Self-pay | Admitting: Obstetrics and Gynecology

## 2018-03-27 VITALS — BP 110/71 | HR 75 | Wt 178.9 lb

## 2018-03-27 DIAGNOSIS — Z3482 Encounter for supervision of other normal pregnancy, second trimester: Secondary | ICD-10-CM

## 2018-03-27 LAB — POCT URINALYSIS DIPSTICK
GLUCOSE UA: NEGATIVE
KETONES UA: NEGATIVE
NITRITE UA: NEGATIVE
Odor: NEGATIVE
RBC UA: NEGATIVE
SPEC GRAV UA: 1.01 (ref 1.010–1.025)
Urobilinogen, UA: 0.2 E.U./dL
pH, UA: 7.5 (ref 5.0–8.0)

## 2018-03-27 MED ORDER — PANTOPRAZOLE SODIUM 20 MG PO TBEC: 20.0000 mg | DELAYED_RELEASE_TABLET | Freq: Two times a day (BID) | ORAL | 1 refills | Status: DC

## 2018-03-27 NOTE — Progress Notes (Addendum)
ROB: Consider AFP for spina bifida-not sure yet.  Panorama normal.  Patient says Zantac not working wants to try Protonix. FAS next visit

## 2018-03-27 NOTE — Progress Notes (Signed)
ROB- unsure about 2nd trimester genetic testing. Wants another med for heartburn. Needs rx for preg massage.

## 2018-04-04 ENCOUNTER — Other Ambulatory Visit: Payer: Self-pay | Admitting: Obstetrics and Gynecology

## 2018-04-04 DIAGNOSIS — Z3689 Encounter for other specified antenatal screening: Secondary | ICD-10-CM

## 2018-04-18 ENCOUNTER — Ambulatory Visit (INDEPENDENT_AMBULATORY_CARE_PROVIDER_SITE_OTHER): Payer: 59

## 2018-04-18 ENCOUNTER — Ambulatory Visit (INDEPENDENT_AMBULATORY_CARE_PROVIDER_SITE_OTHER): Payer: 59 | Admitting: Obstetrics and Gynecology

## 2018-04-18 VITALS — BP 95/60 | HR 72 | Wt 185.0 lb

## 2018-04-18 DIAGNOSIS — Z3689 Encounter for other specified antenatal screening: Secondary | ICD-10-CM | POA: Diagnosis not present

## 2018-04-18 DIAGNOSIS — Z3480 Encounter for supervision of other normal pregnancy, unspecified trimester: Secondary | ICD-10-CM

## 2018-04-18 LAB — POCT URINALYSIS DIPSTICK
Bilirubin, UA: NEGATIVE
Glucose, UA: NEGATIVE
KETONES UA: NEGATIVE
LEUKOCYTES UA: NEGATIVE
NITRITE UA: NEGATIVE
PROTEIN UA: POSITIVE — AB
Spec Grav, UA: 1.01 (ref 1.010–1.025)
UROBILINOGEN UA: 0.2 U/dL
pH, UA: 8 (ref 5.0–8.0)

## 2018-04-18 NOTE — Progress Notes (Signed)
ROB-pt stated that she is doing well no concerns.  

## 2018-04-18 NOTE — Addendum Note (Signed)
Addended by: Silvano BilisHAMPTON, Schuyler Olden L on: 04/18/2018 12:09 PM   Modules accepted: Orders

## 2018-04-18 NOTE — Progress Notes (Signed)
ROB: Doing well, no complaints.  S/p normal anatomy scan. All general questions regarding pregnancy answered. Declines mSAFP. RTC in 4 weeks.

## 2018-04-24 ENCOUNTER — Encounter: Payer: Self-pay | Admitting: Obstetrics and Gynecology

## 2018-04-25 ENCOUNTER — Encounter: Payer: Self-pay | Admitting: Obstetrics and Gynecology

## 2018-05-21 ENCOUNTER — Encounter: Payer: 59 | Admitting: Obstetrics and Gynecology

## 2018-05-22 ENCOUNTER — Encounter: Payer: Self-pay | Admitting: Obstetrics and Gynecology

## 2018-05-22 ENCOUNTER — Ambulatory Visit (INDEPENDENT_AMBULATORY_CARE_PROVIDER_SITE_OTHER): Payer: 59 | Admitting: Obstetrics and Gynecology

## 2018-05-22 VITALS — BP 107/69 | HR 85 | Wt 191.0 lb

## 2018-05-22 DIAGNOSIS — Z3482 Encounter for supervision of other normal pregnancy, second trimester: Secondary | ICD-10-CM

## 2018-05-22 LAB — POCT URINALYSIS DIPSTICK
Bilirubin, UA: NEGATIVE
Blood, UA: NEGATIVE
GLUCOSE UA: NEGATIVE
KETONES UA: NEGATIVE
Leukocytes, UA: NEGATIVE
NITRITE UA: NEGATIVE
Odor: NEGATIVE
PROTEIN UA: NEGATIVE
Spec Grav, UA: <=1.005 — AB (ref 1.010–1.025)
UROBILINOGEN UA: 0.2 U/dL
pH, UA: 7 (ref 5.0–8.0)

## 2018-05-22 NOTE — Progress Notes (Signed)
ROB- BH on/off. Hernia is tender.

## 2018-05-22 NOTE — Progress Notes (Signed)
ROB: Occasional pain from her hernia but not disabling.  Having CS[employer] CorporationBraxton Hicks contractions.  Baby active.  1 hour GCT next visit

## 2018-05-28 ENCOUNTER — Telehealth: Payer: Self-pay | Admitting: Obstetrics and Gynecology

## 2018-05-28 ENCOUNTER — Other Ambulatory Visit: Payer: Self-pay | Admitting: Obstetrics and Gynecology

## 2018-05-28 MED ORDER — PANTOPRAZOLE SODIUM 20 MG PO TBEC: 20.0000 mg | DELAYED_RELEASE_TABLET | Freq: Two times a day (BID) | ORAL | 3 refills | Status: DC

## 2018-05-28 NOTE — Telephone Encounter (Signed)
Samantha Blackwell came in and stated she has 1 day left of her Protonix and her refill script was denied and she was last in the office 05/22/18 w/ Logan BoresEvans.  She needs her Protonix right away if at all possible.  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  Call her back at [phone] [facility][cell w/ voicemail] if needed, please advise, thanks.

## 2018-05-28 NOTE — Telephone Encounter (Signed)
Pt was called no answer LM via voicemail to that her medication has been refilled. Was advised if her medication was correct to please give the office a call.

## 2018-06-07 ENCOUNTER — Emergency Department: Payer: 59

## 2018-06-07 ENCOUNTER — Emergency Department
Admission: EM | Admit: 2018-06-07 | Discharge: 2018-06-07 | Disposition: A | Discharge status: Home / Self Care | Payer: 59 | Attending: Emergency Medicine | Admitting: Emergency Medicine

## 2018-06-07 DIAGNOSIS — Z79899 Other long term (current) drug therapy: Secondary | ICD-10-CM | POA: Diagnosis not present

## 2018-06-07 DIAGNOSIS — N3090 Cystitis, unspecified without hematuria: Secondary | ICD-10-CM

## 2018-06-07 DIAGNOSIS — R1031 Right lower quadrant pain: Secondary | ICD-10-CM | POA: Diagnosis not present

## 2018-06-07 DIAGNOSIS — N200 Calculus of kidney: Secondary | ICD-10-CM | POA: Diagnosis not present

## 2018-06-07 DIAGNOSIS — Z3A28 28 weeks gestation of pregnancy: Secondary | ICD-10-CM | POA: Insufficient documentation

## 2018-06-07 DIAGNOSIS — R55 Syncope and collapse: Secondary | ICD-10-CM | POA: Diagnosis not present

## 2018-06-07 DIAGNOSIS — O2313 Infections of bladder in pregnancy, third trimester: Secondary | ICD-10-CM | POA: Diagnosis not present

## 2018-06-07 DIAGNOSIS — O219 Vomiting of pregnancy, unspecified: Secondary | ICD-10-CM | POA: Insufficient documentation

## 2018-06-07 DIAGNOSIS — Z3A29 29 weeks gestation of pregnancy: Secondary | ICD-10-CM | POA: Diagnosis not present

## 2018-06-07 DIAGNOSIS — R109 Unspecified abdominal pain: Secondary | ICD-10-CM | POA: Diagnosis not present

## 2018-06-07 DIAGNOSIS — O26893 Other specified pregnancy related conditions, third trimester: Secondary | ICD-10-CM | POA: Diagnosis not present

## 2018-06-07 DIAGNOSIS — O9989 Other specified diseases and conditions complicating pregnancy, childbirth and the puerperium: Secondary | ICD-10-CM | POA: Diagnosis not present

## 2018-06-07 DIAGNOSIS — O26833 Pregnancy related renal disease, third trimester: Secondary | ICD-10-CM | POA: Insufficient documentation

## 2018-06-07 LAB — CBC WITH DIFFERENTIAL/PLATELET
BASOS ABS: 0.1 10*3/uL (ref 0–0.1)
BASOS PCT: 1 %
Eosinophils Absolute: 0.1 10*3/uL (ref 0–0.7)
Eosinophils Relative: 1 %
HCT: 28.5 % — ABNORMAL LOW (ref 35.0–47.0)
Hemoglobin: 9.8 g/dL — ABNORMAL LOW (ref 12.0–16.0)
Lymphocytes Relative: 27 %
Lymphs Abs: 2.2 10*3/uL (ref 1.0–3.6)
MCH: 29.7 pg (ref 26.0–34.0)
MCHC: 34.3 g/dL (ref 32.0–36.0)
MCV: 86.5 fL (ref 80.0–100.0)
MONO ABS: 0.6 10*3/uL (ref 0.2–0.9)
Monocytes Relative: 8 %
Neutro Abs: 5.2 10*3/uL (ref 1.4–6.5)
Neutrophils Relative %: 63 %
PLATELETS: 310 10*3/uL (ref 150–440)
RBC: 3.3 MIL/uL — AB (ref 3.80–5.20)
RDW: 13 % (ref 11.5–14.5)
WBC: 8.2 10*3/uL (ref 3.6–11.0)

## 2018-06-07 LAB — URINALYSIS, COMPLETE (UACMP) WITH MICROSCOPIC
Bilirubin Urine: NEGATIVE
GLUCOSE, UA: NEGATIVE mg/dL
HGB URINE DIPSTICK: NEGATIVE
KETONES UR: 20 mg/dL — AB
NITRITE: NEGATIVE
PROTEIN: 30 mg/dL — AB
Specific Gravity, Urine: 1.02 (ref 1.005–1.030)
pH: 6 (ref 5.0–8.0)

## 2018-06-07 LAB — BASIC METABOLIC PANEL
Anion gap: 8 (ref 5–15)
BUN: 7 mg/dL (ref 6–20)
CHLORIDE: 107 mmol/L (ref 98–111)
CO2: 23 mmol/L (ref 22–32)
CREATININE: 0.58 mg/dL (ref 0.44–1.00)
Calcium: 8.4 mg/dL — ABNORMAL LOW (ref 8.9–10.3)
GFR calc Af Amer: >60 mL/min (ref >60)
GFR calc non Af Amer: >60 mL/min (ref >60)
Glucose, Bld: 91 mg/dL (ref 70–99)
POTASSIUM: 3.4 mmol/L — AB (ref 3.5–5.1)
Sodium: 138 mmol/L (ref 135–145)

## 2018-06-07 MED ORDER — ONDANSETRON HCL 4 MG/2ML IJ SOLN
4.0000 mg | Freq: Once | INTRAMUSCULAR | Status: AC
  Administered 2018-06-07: 4 mg via INTRAVENOUS
  Filled 2018-06-07: qty 2

## 2018-06-07 MED ORDER — FENTANYL CITRATE (PF) 100 MCG/2ML IJ SOLN
50.0000 ug | Freq: Once | INTRAMUSCULAR | Status: AC
  Administered 2018-06-07: 50 ug via INTRAVENOUS
  Filled 2018-06-07: qty 2

## 2018-06-07 MED ORDER — SODIUM CHLORIDE 0.9 % IV BOLUS
1000.0000 mL | Freq: Once | INTRAVENOUS | Status: AC
  Administered 2018-06-07: 1000 mL via INTRAVENOUS

## 2018-06-07 MED ORDER — ONDANSETRON 4 MG PO TBDP: 4.0000 mg | ORAL_TABLET | Freq: Three times a day (TID) | ORAL | 0 refills | Status: DC | PRN

## 2018-06-07 MED ORDER — CEPHALEXIN 500 MG PO CAPS: 500.0000 mg | ORAL_CAPSULE | Freq: Two times a day (BID) | ORAL | 0 refills | Status: DC

## 2018-06-07 MED ORDER — ACETAMINOPHEN 10 MG/ML IV SOLN
1000.0000 mg | Freq: Four times a day (QID) | INTRAVENOUS | Status: DC
  Administered 2018-06-07: 1000 mg via INTRAVENOUS
  Filled 2018-06-07 (×3): qty 100

## 2018-06-07 NOTE — Progress Notes (Signed)
   06/07/18 1130  Clinical Encounter Type  Visited With Patient and family together  Visit Type Initial;Spiritual support;ED  Referral From Nurse  Consult/Referral To Chaplain  Spiritual Encounters  Spiritual Needs Prayer;Emotional  Stress Factors  Patient Stress Factors Health changes  Family Stress Factors Health changes   While caring for another patient, I was alert that Samantha Blackwell was in the ED room behind me. I met the patient's husband and mother. Patient was in good spirits but concerned about an infection and bladder and kidney stones. Dr. Aletha Halim determine if she will be admitted or sent home soon. CH will follow up.

## 2018-06-07 NOTE — ED Notes (Signed)
200cc of clear emesis upon arrival. VSS.

## 2018-06-07 NOTE — Progress Notes (Signed)
   06/07/18 1415  Clinical Encounter Type  Visited With Patient and family together  Visit Type Follow-up;ED  Referral From Nurse  Consult/Referral To Chaplain  Spiritual Encounters  Spiritual Needs Emotional   CH followed up with patient and her family. Mrs. Samantha Blackwell was feeling better from this morning but remained upset that this process took 6+ hours and she and her family feel that they have not been attended to as they should have. I attempted to reassure the patient and family and got the mother of the patient some coffee and a warm blanket. I encouraged the patient to complete a survey or send a message through email stating how she felt about her care.

## 2018-06-07 NOTE — ED Notes (Signed)
Fetal heart tones assessed by fetal doppler

## 2018-06-07 NOTE — ED Notes (Signed)
Pt given water and crackers for po challenge. Pt denies any complaints at this time and is well appearing. Call bell within reach, will continue to monitor.

## 2018-06-07 NOTE — Discharge Instructions (Signed)
Your ultrasound shows signs of a right-sided kidney stone, but because your symptoms dramatically improved, it must have passed.  Take Keflex to treat the urinary tract infection and follow-up with Dr. Valentino Saxonherry next week in about 5 days.  If you have any worsening symptoms including fevers chills vomiting or recurrent flank pain, call urology or return to the ED.

## 2018-06-07 NOTE — ED Triage Notes (Signed)
R flank/lower back pain, sharp, intermittent 8/10, hx of kidney stones, a & ox4, color wnl, vss

## 2018-06-07 NOTE — ED Provider Notes (Signed)
[facility] Emergency Department Provider Note  ____________________________________________  Time seen: Approximately 1:58 PM  I have reviewed the triage vital signs and the nursing notes.   HISTORY  Chief Complaint Flank Pain    HPI KYNSLEI ART is a 32 y.o. female with a history of GERD and ovarian cyst and kidney stones who is currently about [redacted] weeks pregnant who complains of  sudden onset of right flank pain radiating to right lower quadrant that started this morning.  Waxing and waning, constant, severe, no aggravating or alleviating factors.  Denies dysuria frequency or urgency.  No fever or chills     Past Medical History:  Diagnosis Date  . Dysmenorrhea   . GERD (gastroesophageal reflux disease)   . History of kidney stones 05/2015  . History of ovarian cyst      Patient Active Problem List   Diagnosis Date Noted  . Vitamin D deficiency 07/16/2015  . Acid reflux 12/10/2014     Past Surgical History:  Procedure Laterality Date  . NO PAST SURGERIES       Prior to Admission medications   Medication Sig Start Date End Date Taking? Authorizing Provider  cephALEXin (KEFLEX) 500 MG capsule Take 1 capsule (500 mg total) by mouth 2 (two) times daily. 06/07/18   Sharman Cheek, MD  cetirizine (ZYRTEC) 10 MG tablet Take 10 mg by mouth daily.    [provider]  fluticasone (FLONASE) 50 MCG/ACT nasal spray Place 2 sprays into both nostrils daily. 04/11/17   Sherrie Mustache Roselyn Bering, PA-C  ondansetron (ZOFRAN ODT) 4 MG disintegrating tablet Take 1 tablet (4 mg total) by mouth every 8 (eight) hours as needed for nausea or vomiting. 06/07/18   Sharman Cheek, MD  pantoprazole (PROTONIX) 20 MG tablet Take 1 tablet (20 mg total) by mouth 2 (two) times daily. 05/28/18   Hildred Laser, MD  prenatal vitamin w/FE, FA (NATACHEW) 29-1 MG CHEW chewable tablet Chew 1 tablet by mouth daily at 12 noon.    [provider]      Allergies Penicillins   Family History  Problem Relation Age of Onset  . Cancer Maternal Grandmother        Breast cancer(reoccurence in contralateral breast in aug 2018) , ovarian cancer (sept 2018)  . Breast cancer Maternal Grandmother   . Breast cancer Maternal Aunt     Social History Social History   Tobacco Use  . Smoking status: Never Smoker  . Smokeless tobacco: Never Used  Substance Use Topics  . Alcohol use: No    Alcohol/week: 0.0 oz  . Drug use: No    Review of Systems  Constitutional:   No fever or chills.   Cardiovascular:   No chest pain or syncope. Respiratory:   No dyspnea or cough. Gastrointestinal:   Positive as above for right flank pain with vomiting.  No constipation. Musculoskeletal:   Negative for focal pain or swelling All other systems reviewed and are negative except as documented above in ROS and HPI.  ____________________________________________   PHYSICAL EXAM:  VITAL SIGNS: ED Triage Vitals  Enc Vitals Group     BP 06/07/18 0824 102/83     Pulse Rate 06/07/18 0824 79     Resp 06/07/18 1030 18     Temp 06/07/18 0824 97.6 F (36.4 C)     Temp src --      SpO2 06/07/18 0824 100 %     Weight 06/07/18 0825 191 lb (86.6 kg)  Height 06/07/18 0825 5\' 8"  (1.727 m)     Head Circumference --      Peak Flow --      Pain Score 06/07/18 0825 8     Pain Loc --      Pain Edu? --      Excl. in GC? --     Vital signs reviewed, nursing assessments reviewed.   Constitutional:   Alert and oriented.  Very uncomfortable but non-toxic appearance. Eyes:   Conjunctivae are normal. EOMI. PERRL. ENT      Head:   Normocephalic and atraumatic.      Nose:   No congestion/rhinnorhea.       Mouth/Throat:   MMM, no pharyngeal erythema. No peritonsillar mass.       Neck:   No meningismus. Full ROM. Hematological/Lymphatic/Immunilogical:   No cervical lymphadenopathy. Cardiovascular:   RRR. Symmetric bilateral radial and DP pulses.  No  murmurs. Cap refill less than 2 seconds. Respiratory:   Normal respiratory effort without tachypnea/retractions. Breath sounds are clear and equal bilaterally. No wheezes/rales/rhonchi. Gastrointestinal:   Soft with mild right flank and right lower quadrant tenderness.. Non distended. There is no CVA tenderness.  No rebound, rigidity, or guarding.  Avid abdomen, size consistent with dates  Musculoskeletal:   Normal range of motion in all extremities. No joint effusions.  No lower extremity tenderness.  No edema. Neurologic:   Normal speech and language.  Motor grossly intact. No acute focal neurologic deficits are appreciated.  Skin:    Skin is warm, dry and intact. No rash noted.  No petechiae, purpura, or bullae.  ____________________________________________    LABS (pertinent positives/negatives) (all labs ordered are listed, but only abnormal results are displayed) Labs Reviewed  BASIC METABOLIC PANEL - Abnormal; Notable for the following components:      Result Value   Potassium 3.4 (*)    Calcium 8.4 (*)    All other components within normal limits  URINALYSIS, COMPLETE (UACMP) WITH MICROSCOPIC - Abnormal; Notable for the following components:   Color, Urine AMBER (*)    APPearance CLOUDY (*)    Ketones, ur 20 (*)    Protein, ur 30 (*)    Leukocytes, UA MODERATE (*)    Bacteria, UA MANY (*)    All other components within normal limits  CBC WITH DIFFERENTIAL/PLATELET - Abnormal; Notable for the following components:   RBC 3.30 (*)    Hemoglobin 9.8 (*)    HCT 28.5 (*)    All other components within normal limits  URINE CULTURE   ____________________________________________   EKG    ____________________________________________    RADIOLOGY  [place] Ob Limited  Result Date: 06/07/2018 CLINICAL DATA:  Right-sided abdominal pain EXAM: LIMITED OBSTETRIC ULTRASOUND FINDINGS: Number of Fetuses: 1 Heart Rate:  140 bpm Movement: Visualized Presentation: Cephalic Placental  Location: Posterior without evident abruption. Previa: None Amniotic Fluid (Subjective): Within normal limits for gestational age. BPD: 7.4 cm 29 w  3 d MATERNAL FINDINGS: Cervix:  Appears closed. Uterus/Adnexae: No abnormality visualized. No free maternal pelvic fluid. IMPRESSION: Single live intrauterine gestational age with estimated gestational age of 29+ weeks based on biparietal diameter. No placental lesion evident. Amniotic fluid volume within normal limits for gestational age. . This exam is performed on an emergent basis to address a specific clinical concern and does not comprehensively evaluate fetal size, dating, or anatomy; follow-up complete OB US should be considered if further fetal assessment is warranted. Electronically Signed   By: Bretta BangWilliam  Woodruff III  M.D.   On: 06/07/2018 09:48   US Renal  Result Date: 06/07/2018 CLINICAL DATA:  Right flank region pain. Third trimester intrauterine gestation. EXAM: RENAL / URINARY TRACT ULTRASOUND COMPLETE COMPARISON:  CT abdomen and pelvis May 22, 2015 FINDINGS: Right Kidney: Length: 11.1 cm. Echogenicity and renal cortical thickness are within normal limits. No mass or perinephric fluid visualized. There is moderate pelvicaliectasis on the right. There is a nonobstructing 6 mm calculus in the upper pole the right kidney. No ureterectasis or ureteral calculus is seen by ultrasound. Left Kidney: Length: 11.5 cm. Echogenicity and renal cortical thickness are within normal limits. No mass, perinephric fluid, or hydronephrosis visualized. No sonographically demonstrable calculus or ureterectasis. Bladder: Appears normal for degree of bladder distention. Note that bladder is nearly empty at the time of this examination. IMPRESSION: 1. Moderate hydronephrosis on the right without obstructing focus evident. Note the urinary bladder is nearly empty at the time of this study. There is a third trimester intrauterine gestation. 2.  6 mm nonobstructing calculus upper  pole right kidney. 3.  Study otherwise unremarkable. Electronically Signed   By: Bretta Bang III M.D.   On: 06/07/2018 09:52    ____________________________________________   PROCEDURES Procedures  ____________________________________________  DIFFERENTIAL DIAGNOSIS   Ureterolithiasis, cystitis, pyelonephritis, appendicitis, biliary colic, ovarian cyst  CLINICAL IMPRESSION / ASSESSMENT AND PLAN / ED COURSE  Pertinent labs & imaging results that were available during my care of the patient were reviewed by me and considered in my medical decision making (see chart for details).    Patient presents with severe pain and vomiting.  Doubt bowel obstruction or perforation.  No symptoms to suggest complication of pregnancy.  Most likely kidney stone.  Start with labs, urinalysis, ultrasound renal and ultrasound OB to assess for ureteral stone versus ovarian cyst.  ----------------------------------------- 2:01 PM on 06/07/2018 -----------------------------------------  Ultrasound shows moderate hydronephrosis on the right consistent with a ureterolithiasis.  Labs also show urinary tract infection, but normal creatinine and electrolytes and normal vital signs.  Soon after the ultrasound, patient reports that her symptoms dramatically improved.  She sitting upright calm and comfortable and essentially back to baseline.  Given the findings it appears that she had a stone in her ureter and although worrisome that is associated with UTI, improving of her symptoms indicates that the stone is passed spontaneously and there is no ongoing ureteral obstruction.  Discussed with urology Dr. Liliane Shi and obstetrics Dr. Valentino Saxon who agreed that they can follow-up with the patient.  Return precautions for any worsening symptoms including fever chills vomiting or worsening pain otherwise, Keflex for UTI and follow-up with obstetrics in about 5 days.       ____________________________________________   FINAL CLINICAL IMPRESSION(S) / ED DIAGNOSES    Final diagnoses:  Right flank pain  Kidney stone  Cystitis     ED Discharge Orders        Ordered    cephALEXin (KEFLEX) 500 MG capsule  2 times daily     06/07/18 1355    ondansetron (ZOFRAN ODT) 4 MG disintegrating tablet  Every 8 hours PRN     06/07/18 1358      Portions of this note were generated with dragon dictation software. Dictation errors may occur despite best attempts at proofreading.    Sharman Cheek, MD 06/07/18 [phone]

## 2018-06-08 LAB — URINE CULTURE: Culture: <10000 — AB

## 2018-06-11 ENCOUNTER — Encounter: Payer: Self-pay | Admitting: Obstetrics and Gynecology

## 2018-06-11 ENCOUNTER — Encounter: Payer: 59 | Admitting: Obstetrics and Gynecology

## 2018-06-13 ENCOUNTER — Other Ambulatory Visit: Payer: 59

## 2018-06-13 ENCOUNTER — Encounter: Payer: Self-pay | Admitting: Obstetrics and Gynecology

## 2018-06-13 ENCOUNTER — Ambulatory Visit (INDEPENDENT_AMBULATORY_CARE_PROVIDER_SITE_OTHER): Payer: 59 | Admitting: Obstetrics and Gynecology

## 2018-06-13 VITALS — BP 97/65 | HR 99 | Wt 192.4 lb

## 2018-06-13 DIAGNOSIS — N200 Calculus of kidney: Secondary | ICD-10-CM | POA: Insufficient documentation

## 2018-06-13 DIAGNOSIS — Z3483 Encounter for supervision of other normal pregnancy, third trimester: Secondary | ICD-10-CM

## 2018-06-13 DIAGNOSIS — O26833 Pregnancy related renal disease, third trimester: Secondary | ICD-10-CM

## 2018-06-13 DIAGNOSIS — O99891 Other specified diseases and conditions complicating pregnancy: Secondary | ICD-10-CM | POA: Insufficient documentation

## 2018-06-13 DIAGNOSIS — Z3A28 28 weeks gestation of pregnancy: Secondary | ICD-10-CM

## 2018-06-13 LAB — POCT URINALYSIS DIPSTICK
BILIRUBIN UA: NEGATIVE
Blood, UA: NEGATIVE
GLUCOSE UA: NEGATIVE
Ketones, UA: NEGATIVE
Nitrite, UA: NEGATIVE
Protein, UA: NEGATIVE
Urobilinogen, UA: 0.2 E.U./dL
pH, UA: 7.5 (ref 5.0–8.0)

## 2018-06-13 MED ORDER — TETANUS-DIPHTH-ACELL PERTUSSIS 5-2.5-18.5 LF-MCG/0.5 IM SUSP
0.5000 mL | Freq: Once | INTRAMUSCULAR | Status: AC
  Administered 2018-06-13: 0.5 mL via INTRAMUSCULAR

## 2018-06-13 NOTE — Progress Notes (Signed)
ROB: Patient was seen in ER last week due to pelvic pain, was diagnosed with kidney stone and hydrouterer.  Patient states that she passed it while in the ER.  Still has 1 additional 1 (6 mm) in the right kidney. Has a h/o kidney stones in the past, has passed at least 5 in the past 2 years. Has never been seen by a Urologist. Recommend that she see one and become established.  Advised that they will not likely do anything other than conservative management while pregnant, however can develop a plan post-pregnancy.  Referral placed. Patient also concerned that she passed her mucus plug. Advised that this can happen early in the third trimester and is not a direct indicator for the onset of labor. Usually will reseal. For 28 week labs today.  Desires to breastfeed,  desires OCPs for contraception. For Tdap today, signed blood consent.  RTC in 2 weeks.

## 2018-06-13 NOTE — Progress Notes (Signed)
ROB-pt stated she has pressure in the groin area and lost her mucus plug this weekend.

## 2018-06-14 ENCOUNTER — Other Ambulatory Visit: Payer: Self-pay | Admitting: Obstetrics and Gynecology

## 2018-06-14 ENCOUNTER — Encounter: Payer: Self-pay | Admitting: Obstetrics and Gynecology

## 2018-06-14 DIAGNOSIS — O99013 Anemia complicating pregnancy, third trimester: Secondary | ICD-10-CM

## 2018-06-14 LAB — CBC
HEMATOCRIT: 29.8 % — AB (ref 34.0–46.6)
HEMOGLOBIN: 9.5 g/dL — AB (ref 11.1–15.9)
MCH: 28.2 pg (ref 26.6–33.0)
MCHC: 31.9 g/dL (ref 31.5–35.7)
MCV: 88 fL (ref 79–97)
Platelets: 349 10*3/uL (ref 150–450)
RBC: 3.37 x10E6/uL — ABNORMAL LOW (ref 3.77–5.28)
RDW: 12.3 % (ref 12.3–15.4)
WBC: 8.5 10*3/uL (ref 3.4–10.8)

## 2018-06-14 LAB — GLUCOSE, 1 HOUR GESTATIONAL: Gestational Diabetes Screen: 99 mg/dL (ref 65–139)

## 2018-06-14 LAB — RPR: RPR: NONREACTIVE

## 2018-06-14 MED ORDER — FERRALET 90 90-1 MG PO TABS: 1.0000 | ORAL_TABLET | Freq: Every day | ORAL | 6 refills | Status: DC

## 2018-06-15 ENCOUNTER — Encounter: Payer: Self-pay | Admitting: Obstetrics and Gynecology

## 2018-06-21 ENCOUNTER — Ambulatory Visit (INDEPENDENT_AMBULATORY_CARE_PROVIDER_SITE_OTHER): Payer: 59 | Admitting: Urology

## 2018-06-21 ENCOUNTER — Encounter: Payer: Self-pay | Admitting: Urology

## 2018-06-21 VITALS — BP 108/71 | HR 97 | Ht 68.0 in | Wt 194.0 lb

## 2018-06-21 DIAGNOSIS — R8271 Bacteriuria: Secondary | ICD-10-CM | POA: Diagnosis not present

## 2018-06-21 DIAGNOSIS — N1339 Other hydronephrosis: Secondary | ICD-10-CM | POA: Diagnosis not present

## 2018-06-21 DIAGNOSIS — N200 Calculus of kidney: Secondary | ICD-10-CM

## 2018-06-21 DIAGNOSIS — O9989 Other specified diseases and conditions complicating pregnancy, childbirth and the puerperium: Secondary | ICD-10-CM

## 2018-06-21 DIAGNOSIS — O26833 Pregnancy related renal disease, third trimester: Secondary | ICD-10-CM | POA: Diagnosis not present

## 2018-06-21 DIAGNOSIS — O99891 Other specified diseases and conditions complicating pregnancy: Secondary | ICD-10-CM

## 2018-06-21 DIAGNOSIS — Z3490 Encounter for supervision of normal pregnancy, unspecified, unspecified trimester: Secondary | ICD-10-CM | POA: Diagnosis not present

## 2018-06-21 LAB — URINALYSIS, COMPLETE
Bilirubin, UA: NEGATIVE
GLUCOSE, UA: NEGATIVE
Ketones, UA: NEGATIVE
Nitrite, UA: NEGATIVE
PH UA: 7 (ref 5.0–7.5)
Protein, UA: NEGATIVE
RBC, UA: NEGATIVE
Specific Gravity, UA: 1.01 (ref 1.005–1.030)
UUROB: 0.2 mg/dL (ref 0.2–1.0)

## 2018-06-21 LAB — MICROSCOPIC EXAMINATION

## 2018-06-21 NOTE — Progress Notes (Signed)
06/21/2018 11:41 AM   Samantha Blackwell 10-25-1986 [id number]  Referring provider: Hildred Laser, MD [address] [place], [place] [address]  Chief Complaint  Patient presents with  . Nephrolithiasis    HPI:  New patient for right hydronephrosis.  She is [redacted] weeks pregnant.  She presented to the emergency department June 07, 2018 with right flank pain.  A renal ultrasound was done which showed a possible 6 mm right upper pole stone although there was not a lot of shadowing.  There was moderate right hydro-ureteral nephrosis.  Her creatinine was 0.58 and UA with many bacteria, 6-10 white cells and 11-20 red cells.  Leukocyte esterase was positive but nitrite negative. She had no dysuria. She was treated with antibiotics.  Her pain resolved in the emergency department although she did not see a specific stone pass. She has a twinge of right flank discomfort on occasion especially if she sleeps on her side the wrong way. She is well today without flank pain. She has no dysuria.   She has a history of kidney stones and had a CT scan in 2016 that showed punctate bilateral renal stones, right hydro-ureteronephrosis with no definite stone but possibly a small right UVJ stone.   PMH: Past Medical History:  Diagnosis Date  . Dysmenorrhea   . GERD (gastroesophageal reflux disease)   . History of kidney stones 05/2015  . History of ovarian cyst     Surgical History: Past Surgical History:  Procedure Laterality Date  . NO PAST SURGERIES      Home Medications:  Allergies as of 06/21/2018      Reactions   Penicillins Rash      Medication List        Accurate as of 06/21/18 11:41 AM. Always use your most recent med list.          cetirizine 10 MG tablet Commonly known as:  ZYRTEC Take 10 mg by mouth daily.   FERRALET 90 90-1 MG Tabs Take 1 tablet by mouth daily.   fluticasone 50 MCG/ACT nasal spray Commonly known as:  FLONASE Place 2 sprays into both nostrils  daily.   ondansetron 4 MG disintegrating tablet Commonly known as:  ZOFRAN-ODT Take 1 tablet (4 mg total) by mouth every 8 (eight) hours as needed for nausea or vomiting.   pantoprazole 20 MG tablet Commonly known as:  PROTONIX Take 1 tablet (20 mg total) by mouth 2 (two) times daily.   prenatal vitamin w/FE, FA 29-1 MG Chew chewable tablet Chew 1 tablet by mouth daily at 12 noon.       Allergies:  Allergies  Allergen Reactions  . Penicillins Rash    Family History: Family History  Problem Relation Age of Onset  . Prostate cancer Father   . Cancer Maternal Grandmother        Breast cancer(reoccurence in contralateral breast in aug 2018) , ovarian cancer (sept 2018)  . Breast cancer Maternal Grandmother   . Breast cancer Maternal Aunt   . Bladder Cancer Neg Hx   . Kidney cancer Neg Hx     Social History:  reports that she has never smoked. She has never used smokeless tobacco. She reports that she does not drink alcohol or use drugs.  ROS: UROLOGY Frequent Urination?: Yes Hard to postpone urination?: No Burning/pain with urination?: No Get up at night to urinate?: Yes Leakage of urine?: No Urine stream starts and stops?: No Trouble starting stream?: No Do you have  to strain to urinate?: No Blood in urine?: No Urinary tract infection?: No Sexually transmitted disease?: No Injury to kidneys or bladder?: No Painful intercourse?: No Weak stream?: No Currently pregnant?: Yes Vaginal bleeding?: No Last menstrual period?: n  Gastrointestinal Nausea?: No Vomiting?: No Indigestion/heartburn?: Yes Diarrhea?: No Constipation?: No  Constitutional Fever: No Night sweats?: No Weight loss?: No Fatigue?: No  Skin Skin rash/lesions?: No Itching?: No  Eyes Blurred vision?: No Double vision?: No  Ears/Nose/Throat Sore throat?: No Sinus problems?: No  Hematologic/Lymphatic Swollen glands?: No Easy bruising?: No  Cardiovascular Leg swelling?: No Chest  pain?: No  Respiratory Cough?: No Shortness of breath?: No  Endocrine Excessive thirst?: No  Musculoskeletal Back pain?: No Joint pain?: No  Neurological Headaches?: No Dizziness?: No  Psychologic Depression?: No Anxiety?: No  Physical Exam: BP 108/71 (BP Location: Left Arm, Patient Position: Sitting, Cuff Size: Normal)   Pulse 97   Ht 5\' 8"  (1.727 m)   Wt 88 kg   LMP 11/25/2017   BMI 29.50 kg/m   Constitutional:  Alert and oriented, No acute distress. HEENT: [place] AT, moist mucus membranes.  Trachea midline, no masses. Cardiovascular: No clubbing, cyanosis, or edema. Respiratory: Normal respiratory effort, no increased work of breathing. GI: Abdomen is soft, nontender, nondistended, no abdominal masses GU: No CVA tenderness Skin: No rashes, bruises or suspicious lesions. Neurologic: Grossly intact, no focal deficits, moving all 4 extremities. Psychiatric: Normal mood and affect.  Laboratory Data: Lab Results  Component Value Date   WBC 8.5 06/13/2018   HGB 9.5 (L) 06/13/2018   HCT 29.8 (L) 06/13/2018   MCV 88 06/13/2018   PLT 349 06/13/2018    Lab Results  Component Value Date   CREATININE 0.58 06/07/2018    No results found for: PSA  No results found for: TESTOSTERONE  No results found for: HGBA1C  Urinalysis    Component Value Date/Time   COLORURINE AMBER (A) 06/07/2018 0942   APPEARANCEUR CLOUDY (A) 06/07/2018 0942   APPEARANCEUR Clear 01/26/2018 0905   LABSPEC 1.020 06/07/2018 0942   PHURINE 6.0 06/07/2018 0942   GLUCOSEU NEGATIVE 06/07/2018 0942   HGBUR NEGATIVE 06/07/2018 0942   BILIRUBINUR neg 06/13/2018 0832   BILIRUBINUR Negative 01/26/2018 0905   KETONESUR 20 (A) 06/07/2018 0942   PROTEINUR Negative 06/13/2018 0832   PROTEINUR 30 (A) 06/07/2018 0942   UROBILINOGEN 0.2 06/13/2018 0832   NITRITE neg 06/13/2018 0832   NITRITE NEGATIVE 06/07/2018 0942   LEUKOCYTESUR Moderate (2+) (A) 06/13/2018 0832   LEUKOCYTESUR Trace (A) 01/26/2018  0905    Lab Results  Component Value Date   LABMICR See below: 01/26/2018   WBCUA 0-5 01/26/2018   RBCUA 0-2 01/26/2018   LABEPIT 0-10 01/26/2018   BACTERIA MANY (A) 06/07/2018    Pertinent Imaging: Renal ultrasound from July 2019 and CT scan from 2016-I reviewed all the images No results found for this or any previous visit. No results found for this or any previous visit. No results found for this or any previous visit. No results found for this or any previous visit. Results for orders placed during the hospital encounter of 06/07/18  US Renal   Narrative CLINICAL DATA:  Right flank region pain. Third trimester intrauterine gestation.  EXAM: RENAL / URINARY TRACT ULTRASOUND COMPLETE  COMPARISON:  CT abdomen and pelvis May 22, 2015  FINDINGS: Right Kidney:  Length: 11.1 cm. Echogenicity and renal cortical thickness are within normal limits. No mass or perinephric fluid visualized. There is moderate pelvicaliectasis on the  right. There is a nonobstructing 6 mm calculus in the upper pole the right kidney. No ureterectasis or ureteral calculus is seen by ultrasound.  Left Kidney:  Length: 11.5 cm. Echogenicity and renal cortical thickness are within normal limits. No mass, perinephric fluid, or hydronephrosis visualized. No sonographically demonstrable calculus or ureterectasis.  Bladder:  Appears normal for degree of bladder distention. Note that bladder is nearly empty at the time of this examination.  IMPRESSION: 1. Moderate hydronephrosis on the right without obstructing focus evident. Note the urinary bladder is nearly empty at the time of this study. There is a third trimester intrauterine gestation.  2.  6 mm nonobstructing calculus upper pole right kidney.  3.  Study otherwise unremarkable.   Electronically Signed   By: Bretta Bang III M.D.   On: 06/07/2018 09:52    No results found for this or any previous visit. No results found for this  or any previous visit. Results for orders placed during the hospital encounter of 05/22/15  CT RENAL STONE STUDY   Narrative CLINICAL DATA:  Right flank pain beginning at 1 a.m. 05/22/2015. Initial encounter.  EXAM: CT ABDOMEN AND PELVIS WITHOUT CONTRAST  TECHNIQUE: Multidetector CT imaging of the abdomen and pelvis was performed following the standard protocol without IV contrast.  COMPARISON:  None.  FINDINGS: The lung bases are clear.  No pleural or pericardial effusion.  The patient has moderate hydronephrosis with stranding about the right kidney and ureter but no ureteral stone is identified with pelvic phleboliths seen. The patient has single punctate nonobstructing bilateral renal stones.  The gallbladder, liver, spleen, adrenal glands, pancreas, gallbladder and biliary tree all appear normal. The urinary bladder is unremarkable. The uterus and adnexa appear normal. The stomach, small and large bowel and appendix appear normal. There is no lymphadenopathy or fluid. Small fat containing midline and umbilical hernias are identified. No focal bony abnormality.  IMPRESSION: No ureteral or urinary bladder stones are identified. Question recent stone passage or possibly infection.  Single punctate nonobstructing bilateral renal stones.  Small fat containing midline and umbilical hernias.   Electronically Signed   By: Drusilla Kanner M.D.   On: 05/22/2015 15:14     Assessment & Plan:    1.  Right hydronephrosis-discussed with patient not certain if the hydronephrosis was from passing a stone or hydronephrosis of pregnancy.  It is interesting her 2016 CT showed right hydronephrosis without a definite stone although I thought there might be a right UVJ stone.  Nonetheless, we discussed the majority of pregnant patients will pass kidney stones during pregnancy although it may require multiple admissions for monitoring, IV fluids and pain medicines.  Therefore if she has  any uncontrolled pain, fevers, chills nausea or vomiting or any other concerns she should go to the emergency department immediately.  Intervention is rarely needed and only done in acute situations with a possible nephrostomy tube or stent.  I do recommend she follow-up after delivery in about 3 months with a follow-up renal ultrasound to ensure the hydronephrosis has resolved. Until then, there is no GU monitoring that needs to be done outside of her routine OB f/u which typically follows the UA, proteinuria, bmp, etc. Again, emphasized the importance of going to ED for any fever, NV, flank pain, etc.   - Urinalysis, Complete  2.  Nephrolithiasis- as above, we will add a KUB with her imaging in 3 months.  3. Asymptomatic bacteriuria - will send urine for cx today and treat if  positive given her pregnancy.   No follow-ups on file.  Jerilee Field, MD  [facility] [address], [address] [place], [place] [address] [phone]

## 2018-06-23 LAB — CULTURE, URINE COMPREHENSIVE

## 2018-06-26 ENCOUNTER — Encounter: Payer: Self-pay | Admitting: Obstetrics and Gynecology

## 2018-06-26 ENCOUNTER — Ambulatory Visit (INDEPENDENT_AMBULATORY_CARE_PROVIDER_SITE_OTHER): Payer: 59 | Admitting: Obstetrics and Gynecology

## 2018-06-26 VITALS — BP 96/67 | HR 90 | Wt 193.0 lb

## 2018-06-26 DIAGNOSIS — Z3483 Encounter for supervision of other normal pregnancy, third trimester: Secondary | ICD-10-CM | POA: Diagnosis not present

## 2018-06-26 LAB — POCT URINALYSIS DIPSTICK
Bilirubin, UA: NEGATIVE
GLUCOSE UA: NEGATIVE
KETONES UA: NEGATIVE
NITRITE UA: NEGATIVE
PROTEIN UA: POSITIVE — AB
RBC UA: NEGATIVE
SPEC GRAV UA: 1.01 (ref 1.010–1.025)
Urobilinogen, UA: 0.2 E.U./dL
pH, UA: 7 (ref 5.0–8.0)

## 2018-06-26 NOTE — Progress Notes (Signed)
Pt states right kidney is still sore, but she has no problems urinating. Taking tylenol to ease pain.

## 2018-06-26 NOTE — Progress Notes (Signed)
ROB: Patient diagnosed with kidney stones.  Past 1 of them but another remains.  She is not having discomfort from it.  She has seen a urologist and received warnings regarding the stone.  Reports active daily fetal movement.  No problems pertaining to the pregnancy.  Follow size greater than dates consider ultrasound later in pregnancy.

## 2018-07-10 ENCOUNTER — Ambulatory Visit (INDEPENDENT_AMBULATORY_CARE_PROVIDER_SITE_OTHER): Payer: 59 | Admitting: Obstetrics and Gynecology

## 2018-07-10 VITALS — BP 104/66 | HR 82 | Wt 193.7 lb

## 2018-07-10 DIAGNOSIS — Z3483 Encounter for supervision of other normal pregnancy, third trimester: Secondary | ICD-10-CM

## 2018-07-10 DIAGNOSIS — O26899 Other specified pregnancy related conditions, unspecified trimester: Secondary | ICD-10-CM

## 2018-07-10 DIAGNOSIS — R102 Pelvic and perineal pain: Secondary | ICD-10-CM

## 2018-07-10 LAB — POCT URINALYSIS DIPSTICK OB
Bilirubin, UA: NEGATIVE
Blood, UA: NEGATIVE
Glucose, UA: NEGATIVE — AB
Ketones, UA: NEGATIVE
NITRITE UA: NEGATIVE
PROTEIN: NEGATIVE
Spec Grav, UA: 1.01 (ref 1.010–1.025)
Urobilinogen, UA: 0.2 E.U./dL
pH, UA: 7 (ref 5.0–8.0)

## 2018-07-10 NOTE — Progress Notes (Signed)
ROB: C/o sharp pelvic pain. Difficulty walking at times.  Discussed pregnancy girdle. RTC in 2 weeks.

## 2018-07-10 NOTE — Progress Notes (Signed)
ROB- PT stated that she is having sharp lower pelvic pain. No complaints.

## 2018-07-23 ENCOUNTER — Other Ambulatory Visit: Payer: Self-pay

## 2018-07-23 NOTE — Telephone Encounter (Signed)
Pt called and informed via voicemail that her [employer] paper work were completed and faxed in.

## 2018-07-25 ENCOUNTER — Ambulatory Visit (INDEPENDENT_AMBULATORY_CARE_PROVIDER_SITE_OTHER): Payer: 59 | Admitting: Obstetrics and Gynecology

## 2018-07-25 ENCOUNTER — Encounter: Payer: Self-pay | Admitting: Obstetrics and Gynecology

## 2018-07-25 ENCOUNTER — Other Ambulatory Visit: Payer: Self-pay | Admitting: Obstetrics and Gynecology

## 2018-07-25 VITALS — BP 102/69 | HR 89 | Wt 194.0 lb

## 2018-07-25 DIAGNOSIS — R319 Hematuria, unspecified: Secondary | ICD-10-CM

## 2018-07-25 DIAGNOSIS — O26843 Uterine size-date discrepancy, third trimester: Secondary | ICD-10-CM

## 2018-07-25 DIAGNOSIS — Z3483 Encounter for supervision of other normal pregnancy, third trimester: Secondary | ICD-10-CM

## 2018-07-25 LAB — POCT URINALYSIS DIPSTICK OB
Bilirubin, UA: NEGATIVE
Blood, UA: NEGATIVE
Glucose, UA: NEGATIVE
Ketones, UA: NEGATIVE
LEUKOCYTES UA: NEGATIVE
NITRITE UA: NEGATIVE
PH UA: 7 (ref 5.0–8.0)
PROTEIN: NEGATIVE
Spec Grav, UA: <=1.005 — AB (ref 1.010–1.025)
UROBILINOGEN UA: 0.2 U/dL

## 2018-07-25 NOTE — Progress Notes (Signed)
ROB: Patient with pelvic pressure symptoms but otherwise no complaints.  Mild hematuria noted-urine sent for culture and sensitivity.  Size greater than dates-ultrasound ordered.  Needs cultures next visit.

## 2018-07-25 NOTE — Progress Notes (Signed)
Pt presents today for ROB. Pt is still having lower abdominal pain. Otherwise she is doing well.

## 2018-07-27 ENCOUNTER — Ambulatory Visit (INDEPENDENT_AMBULATORY_CARE_PROVIDER_SITE_OTHER): Payer: 59

## 2018-07-27 DIAGNOSIS — Z3A36 36 weeks gestation of pregnancy: Secondary | ICD-10-CM

## 2018-07-27 DIAGNOSIS — O26843 Uterine size-date discrepancy, third trimester: Secondary | ICD-10-CM

## 2018-07-27 LAB — URINE CULTURE

## 2018-08-07 ENCOUNTER — Encounter: Payer: Self-pay | Admitting: Obstetrics and Gynecology

## 2018-08-07 ENCOUNTER — Ambulatory Visit (INDEPENDENT_AMBULATORY_CARE_PROVIDER_SITE_OTHER): Payer: 59 | Admitting: Obstetrics and Gynecology

## 2018-08-07 VITALS — BP 118/76 | HR 73 | Wt 196.0 lb

## 2018-08-07 DIAGNOSIS — Z3483 Encounter for supervision of other normal pregnancy, third trimester: Secondary | ICD-10-CM | POA: Diagnosis not present

## 2018-08-07 LAB — POCT URINALYSIS DIPSTICK OB
BILIRUBIN UA: NEGATIVE
Blood, UA: NEGATIVE
Glucose, UA: NEGATIVE
KETONES UA: NEGATIVE
NITRITE UA: NEGATIVE
PH UA: 7 (ref 5.0–8.0)
POC,PROTEIN,UA: NEGATIVE
UROBILINOGEN UA: 0.2 U/dL

## 2018-08-07 NOTE — Progress Notes (Signed)
   ROB-PT stated that she is doing well no complaints.    

## 2018-08-07 NOTE — Progress Notes (Signed)
ROB: Discussed ultrasound findings.  Signs and symptoms of labor discussed.  GC/CT-GBS performed.  Cervix soft but not dilated or effaced.  Discussed epidural in labor.

## 2018-08-09 LAB — GC/CHLAMYDIA PROBE AMP
Chlamydia trachomatis, NAA: NEGATIVE
Neisseria gonorrhoeae by PCR: NEGATIVE

## 2018-08-09 LAB — STREP GP B NAA+RFLX: Strep Gp B NAA+Rflx: NEGATIVE

## 2018-08-15 ENCOUNTER — Ambulatory Visit (INDEPENDENT_AMBULATORY_CARE_PROVIDER_SITE_OTHER): Payer: 59 | Admitting: Obstetrics and Gynecology

## 2018-08-15 VITALS — BP 120/76 | HR 86 | Wt 195.5 lb

## 2018-08-15 DIAGNOSIS — Z3483 Encounter for supervision of other normal pregnancy, third trimester: Secondary | ICD-10-CM

## 2018-08-15 LAB — POCT URINALYSIS DIPSTICK OB
BILIRUBIN UA: NEGATIVE
GLUCOSE, UA: NEGATIVE
Ketones, UA: NEGATIVE
Nitrite, UA: NEGATIVE
POC,PROTEIN,UA: NEGATIVE
RBC UA: NEGATIVE
Spec Grav, UA: <=1.005 — AB (ref 1.010–1.025)
Urobilinogen, UA: 0.2 E.U./dL
pH, UA: 7.5 (ref 5.0–8.0)

## 2018-08-15 NOTE — Progress Notes (Signed)
   ROB-PT stated that she is having some contractions and cramping along with some ankle swelling.

## 2018-08-15 NOTE — Progress Notes (Signed)
ROB: Patient notes some mild swelling of the feet but otherwise ok.  Concerned that BP is a little high for her, given reassurance, as no proteinuria noted.  Discussed labor precautions. Cultures negative. RTC in 1 week.

## 2018-08-22 ENCOUNTER — Ambulatory Visit (INDEPENDENT_AMBULATORY_CARE_PROVIDER_SITE_OTHER): Payer: 59 | Admitting: Obstetrics and Gynecology

## 2018-08-22 ENCOUNTER — Encounter: Payer: Self-pay | Admitting: Obstetrics and Gynecology

## 2018-08-22 VITALS — BP 121/79 | HR 86 | Wt 195.0 lb

## 2018-08-22 DIAGNOSIS — Z3483 Encounter for supervision of other normal pregnancy, third trimester: Secondary | ICD-10-CM

## 2018-08-22 LAB — POCT URINALYSIS DIPSTICK OB
Bilirubin, UA: NEGATIVE
Blood, UA: NEGATIVE
Glucose, UA: NEGATIVE
Ketones, UA: NEGATIVE
LEUKOCYTES UA: NEGATIVE
Nitrite, UA: NEGATIVE
PROTEIN: NEGATIVE
Urobilinogen, UA: 0.2 E.U./dL
pH, UA: 7 (ref 5.0–8.0)

## 2018-08-22 NOTE — Progress Notes (Signed)
Pt presents today for ROB. Pt is doing well and has no concerns. 

## 2018-08-22 NOTE — Progress Notes (Signed)
ROB: No complaints.  Rare contractions.  Reports active daily fetal movement.

## 2018-08-29 ENCOUNTER — Inpatient Hospital Stay: Payer: 59 | Admitting: Anesthesiology

## 2018-08-29 ENCOUNTER — Other Ambulatory Visit: Payer: Self-pay

## 2018-08-29 ENCOUNTER — Inpatient Hospital Stay
Admission: EM | Admit: 2018-08-29 | Discharge: 2018-08-31 | DRG: 806 | Disposition: A | Discharge status: Home / Self Care | Payer: 59 | Attending: Obstetrics and Gynecology | Admitting: Obstetrics and Gynecology

## 2018-08-29 ENCOUNTER — Ambulatory Visit (INDEPENDENT_AMBULATORY_CARE_PROVIDER_SITE_OTHER): Payer: 59 | Admitting: Obstetrics and Gynecology

## 2018-08-29 DIAGNOSIS — O324XX0 Maternal care for high head at term, not applicable or unspecified: Secondary | ICD-10-CM | POA: Diagnosis present

## 2018-08-29 DIAGNOSIS — O4292 Full-term premature rupture of membranes, unspecified as to length of time between rupture and onset of labor: Secondary | ICD-10-CM

## 2018-08-29 DIAGNOSIS — Z87442 Personal history of urinary calculi: Secondary | ICD-10-CM

## 2018-08-29 DIAGNOSIS — Z3483 Encounter for supervision of other normal pregnancy, third trimester: Secondary | ICD-10-CM

## 2018-08-29 DIAGNOSIS — K219 Gastro-esophageal reflux disease without esophagitis: Secondary | ICD-10-CM | POA: Diagnosis present

## 2018-08-29 DIAGNOSIS — O9902 Anemia complicating childbirth: Secondary | ICD-10-CM | POA: Diagnosis present

## 2018-08-29 DIAGNOSIS — O4290 Premature rupture of membranes, unspecified as to length of time between rupture and onset of labor, unspecified weeks of gestation: Secondary | ICD-10-CM | POA: Diagnosis present

## 2018-08-29 DIAGNOSIS — O4202 Full-term premature rupture of membranes, onset of labor within 24 hours of rupture: Secondary | ICD-10-CM | POA: Diagnosis not present

## 2018-08-29 DIAGNOSIS — D649 Anemia, unspecified: Secondary | ICD-10-CM | POA: Diagnosis present

## 2018-08-29 DIAGNOSIS — O9962 Diseases of the digestive system complicating childbirth: Secondary | ICD-10-CM | POA: Diagnosis present

## 2018-08-29 DIAGNOSIS — Z3A39 39 weeks gestation of pregnancy: Secondary | ICD-10-CM

## 2018-08-29 DIAGNOSIS — Z880 Allergy status to penicillin: Secondary | ICD-10-CM | POA: Diagnosis not present

## 2018-08-29 LAB — POCT URINALYSIS DIPSTICK OB
BILIRUBIN UA: NEGATIVE
Blood, UA: NEGATIVE
Glucose, UA: NEGATIVE
Ketones, UA: NEGATIVE
Leukocytes, UA: NEGATIVE
Nitrite, UA: NEGATIVE
PH UA: 7 (ref 5.0–8.0)
POC,PROTEIN,UA: NEGATIVE
Spec Grav, UA: <=1.005 — AB (ref 1.010–1.025)
UROBILINOGEN UA: 0.2 U/dL

## 2018-08-29 LAB — TYPE AND SCREEN
ABO/RH(D): A POS
Antibody Screen: NEGATIVE

## 2018-08-29 LAB — CBC
HEMATOCRIT: 32.5 % — AB (ref 36.0–46.0)
Hemoglobin: 10.6 g/dL — ABNORMAL LOW (ref 12.0–15.0)
MCH: 29 pg (ref 26.0–34.0)
MCHC: 32.6 g/dL (ref 30.0–36.0)
MCV: 89 fL (ref 80.0–100.0)
PLATELETS: 224 10*3/uL (ref 150–400)
RBC: 3.65 MIL/uL — AB (ref 3.87–5.11)
RDW: 15.6 % — ABNORMAL HIGH (ref 11.5–15.5)
WBC: 7.9 10*3/uL (ref 4.0–10.5)
nRBC: 0 % (ref 0.0–0.2)

## 2018-08-29 MED ORDER — LACTATED RINGERS IV SOLN: 500.0000 mL | Freq: Once | INTRAVENOUS | Status: DC

## 2018-08-29 MED ORDER — FENTANYL 2.5 MCG/ML W/ROPIVACAINE 0.15% IN NS 100 ML EPIDURAL (ARMC): 12.0000 mL/h | EPIDURAL | Status: DC

## 2018-08-29 MED ORDER — LIDOCAINE HCL (PF) 1 % IJ SOLN
INTRAMUSCULAR | Status: DC | PRN
  Administered 2018-08-29: 3 mL via INTRADERMAL

## 2018-08-29 MED ORDER — BUPIVACAINE HCL (PF) 0.25 % IJ SOLN
INTRAMUSCULAR | Status: DC | PRN
  Administered 2018-08-29 (×2): 4 mL via EPIDURAL

## 2018-08-29 MED ORDER — TERBUTALINE SULFATE 1 MG/ML IJ SOLN: 0.2500 mg | Freq: Once | INTRAMUSCULAR | Status: DC | PRN

## 2018-08-29 MED ORDER — LACTATED RINGERS IV SOLN: 500.0000 mL | INTRAVENOUS | Status: DC | PRN

## 2018-08-29 MED ORDER — LIDOCAINE HCL (PF) 1 % IJ SOLN
INTRAMUSCULAR | Status: AC
  Filled 2018-08-29: qty 30

## 2018-08-29 MED ORDER — ACETAMINOPHEN 325 MG PO TABS: 650.0000 mg | ORAL_TABLET | ORAL | Status: DC | PRN

## 2018-08-29 MED ORDER — EPHEDRINE 5 MG/ML INJ
10.0000 mg | INTRAVENOUS | Status: DC | PRN
  Filled 2018-08-29: qty 2

## 2018-08-29 MED ORDER — ONDANSETRON HCL 4 MG/2ML IJ SOLN: 4.0000 mg | Freq: Four times a day (QID) | INTRAMUSCULAR | Status: DC | PRN

## 2018-08-29 MED ORDER — LACTATED RINGERS IV SOLN
INTRAVENOUS | Status: DC
  Administered 2018-08-29 (×2): via INTRAVENOUS

## 2018-08-29 MED ORDER — PHENYLEPHRINE 40 MCG/ML (10ML) SYRINGE FOR IV PUSH (FOR BLOOD PRESSURE SUPPORT)
80.0000 ug | PREFILLED_SYRINGE | INTRAVENOUS | Status: DC | PRN
  Filled 2018-08-29: qty 5

## 2018-08-29 MED ORDER — OXYTOCIN 10 UNIT/ML IJ SOLN
INTRAMUSCULAR | Status: AC
  Filled 2018-08-29: qty 2

## 2018-08-29 MED ORDER — AMMONIA AROMATIC IN INHA
RESPIRATORY_TRACT | Status: AC
  Filled 2018-08-29: qty 10

## 2018-08-29 MED ORDER — LIDOCAINE-EPINEPHRINE (PF) 1.5 %-1:200000 IJ SOLN
INTRAMUSCULAR | Status: DC | PRN
  Administered 2018-08-29: 4 mL via EPIDURAL

## 2018-08-29 MED ORDER — FENTANYL 2.5 MCG/ML W/ROPIVACAINE 0.15% IN NS 100 ML EPIDURAL (ARMC)
EPIDURAL | Status: AC
  Filled 2018-08-29: qty 100

## 2018-08-29 MED ORDER — FENTANYL 2.5 MCG/ML W/ROPIVACAINE 0.15% IN NS 100 ML EPIDURAL (ARMC)
EPIDURAL | Status: DC | PRN
  Administered 2018-08-29: 12 mL/h via EPIDURAL

## 2018-08-29 MED ORDER — OXYTOCIN 40 UNITS IN LACTATED RINGERS INFUSION - SIMPLE MED
1.0000 m[IU]/min | INTRAVENOUS | Status: DC
  Administered 2018-08-29: 4 m[IU]/min via INTRAVENOUS
  Filled 2018-08-29: qty 1000

## 2018-08-29 MED ORDER — BUTORPHANOL TARTRATE 1 MG/ML IJ SOLN: 1.0000 mg | INTRAMUSCULAR | Status: DC | PRN

## 2018-08-29 MED ORDER — SOD CITRATE-CITRIC ACID 500-334 MG/5ML PO SOLN: 30.0000 mL | ORAL | Status: DC | PRN

## 2018-08-29 MED ORDER — OXYTOCIN 40 UNITS IN LACTATED RINGERS INFUSION - SIMPLE MED: 2.5000 [IU]/h | INTRAVENOUS | Status: DC

## 2018-08-29 MED ORDER — MISOPROSTOL 200 MCG PO TABS
ORAL_TABLET | ORAL | Status: AC
  Filled 2018-08-29: qty 4

## 2018-08-29 MED ORDER — OXYTOCIN BOLUS FROM INFUSION
500.0000 mL | Freq: Once | INTRAVENOUS | Status: AC
  Administered 2018-08-29: 500 mL via INTRAVENOUS

## 2018-08-29 MED ORDER — OXYCODONE-ACETAMINOPHEN 5-325 MG PO TABS: 1.0000 | ORAL_TABLET | ORAL | Status: DC | PRN

## 2018-08-29 MED ORDER — LIDOCAINE HCL (PF) 1 % IJ SOLN: 30.0000 mL | INTRAMUSCULAR | Status: DC | PRN

## 2018-08-29 MED ORDER — OXYCODONE-ACETAMINOPHEN 5-325 MG PO TABS: 2.0000 | ORAL_TABLET | ORAL | Status: DC | PRN

## 2018-08-29 MED ORDER — DIPHENHYDRAMINE HCL 50 MG/ML IJ SOLN: 12.5000 mg | INTRAMUSCULAR | Status: DC | PRN

## 2018-08-29 NOTE — H&P (Signed)
Obstetric History and Physical  KADIAN BARCELLOS is a 32 y.o. G2P1001 with IUP at [redacted]w[redacted]d presenting for ruptured membranes. Patient states she has been having mild off and on contractions, no vaginal bleeding, intact, ruptured membranes since office visit this morning with clear fluid, with active fetal movement.    Prenatal Course Source of Care: [facility] with onset of care at 8 weeks Pregnancy complications or risks: Patient Active Problem List   Diagnosis Date Noted  . PROM (premature rupture of membranes) 08/29/2018  . Kidney stone complicating pregnancy, third trimester 06/13/2018  . Vitamin D deficiency 07/16/2015  . Supervision of normal pregnancy 04/26/2015  . Acid reflux 12/10/2014   She plans to breastfeed She desires oral contraceptives (estrogen/progesterone) for postpartum contraception.   Prenatal labs and studies: ABO, Rh: A/Positive/-- (03/15 7829) Antibody: Negative (03/15 0938) Rubella: 6.48 (03/15 0938) RPR: Non Reactive (07/31 0925)  HBsAg: Negative (03/15 5621)  HIV: Non Reactive (03/15 3086)  GBS: Negative (09/24 1405) 1 hr Glucola  normal Genetic screening normal Anatomy US normal   Past Medical History:  Diagnosis Date  . Dysmenorrhea   . GERD (gastroesophageal reflux disease)   . History of kidney stones 05/2015  . History of ovarian cyst     Past Surgical History:  Procedure Laterality Date  . NO PAST SURGERIES      OB History  Gravida Para Term Preterm AB Living  2 1 1     1   SAB TAB Ectopic Multiple Live Births        0 1    # Outcome Date GA Lbr Len/2nd Weight Sex Delivery Anes PTL Lv  2 Current           1 Term 04/26/15 [redacted]w[redacted]d 03:01 / 00:51 3340 g M Vag-Spont None  LIV    Social History   Socioeconomic History  . Marital status: Married    Spouse name: Not on file  . Number of children: Not on file  . Years of education: Not on file  . Highest education level: Not on file  Occupational History  . Not on file   Social Needs  . Financial resource strain: Not on file  . Food insecurity:    Worry: Not on file    Inability: Not on file  . Transportation needs:    Medical: Not on file    Non-medical: Not on file  Tobacco Use  . Smoking status: Never Smoker  . Smokeless tobacco: Never Used  Substance and Sexual Activity  . Alcohol use: No    Alcohol/week: 0.0 standard drinks  . Drug use: No  . Sexual activity: Yes    Birth control/protection: None  Lifestyle  . Physical activity:    Days per week: Not on file    Minutes per session: Not on file  . Stress: Not on file  Relationships  . Social connections:    Talks on phone: Not on file    Gets together: Not on file    Attends religious service: Not on file    Active member of club or organization: Not on file    Attends meetings of clubs or organizations: Not on file    Relationship status: Not on file  Other Topics Concern  . Not on file  Social History Narrative  . Not on file    Family History  Problem Relation Age of Onset  . Prostate cancer Father   . Cancer Maternal Grandmother  Breast cancer(reoccurence in contralateral breast in aug 2018) , ovarian cancer (sept 2018)  . Breast cancer Maternal Grandmother   . Breast cancer Maternal Aunt   . Bladder Cancer Neg Hx   . Kidney cancer Neg Hx     Medications Prior to Admission  Medication Sig Dispense Refill Last Dose  . cetirizine (ZYRTEC) 10 MG tablet Take 10 mg by mouth daily.   Taking  . Fe Cbn-Fe Gluc-FA-B12-C-DSS (FERRALET 90) 90-1 MG TABS Take 1 tablet by mouth daily. 30 each 6 Taking  . fluticasone (FLONASE) 50 MCG/ACT nasal spray Place 2 sprays into both nostrils daily. 16 g 6 Taking  . pantoprazole (PROTONIX) 20 MG tablet Take 1 tablet (20 mg total) by mouth 2 (two) times daily. 60 tablet 3 Taking  . prenatal vitamin w/FE, FA (NATACHEW) 29-1 MG CHEW chewable tablet Chew 1 tablet by mouth daily at 12 noon.   Taking    Allergies  Allergen Reactions  .  Penicillins Rash    Review of Systems: Negative except for what is mentioned in HPI.  Physical Exam: BP 133/70 (BP Location: Left Arm)   Pulse 79   Temp 98.2 F (36.8 C) (Oral)   Resp 18   LMP 11/25/2017  CONSTITUTIONAL: Well-developed, well-nourished female in no acute distress.  HENT:  Normocephalic, atraumatic, External right and left ear normal. Oropharynx is clear and moist EYES: Conjunctivae and EOM are normal. Pupils are equal, round, and reactive to light. No scleral icterus.  NECK: Normal range of motion, supple, no masses SKIN: Skin is warm and dry. No rash noted. Not diaphoretic. No erythema. No pallor. NEUROLOGIC: Alert and oriented to person, place, and time. Normal reflexes, muscle tone coordination. No cranial nerve deficit noted. PSYCHIATRIC: Normal mood and affect. Normal behavior. Normal judgment and thought content. CARDIOVASCULAR: Normal heart rate noted, regular rhythm RESPIRATORY: Effort and breath sounds normal, no problems with respiration noted ABDOMEN: Soft, nontender, nondistended, gravid. MUSCULOSKELETAL: Normal range of motion. No edema and no tenderness. 2+ distal pulses.  Cervical Exam: Dilatation 1.5 cm   Effacement 40%   Station -3   Presentation: cephalic FHT:  Baseline rate 120 bpm   Variability moderate  Accelerations present   Decelerations none Contractions: Every 2-5 mins   Pertinent Labs/Studies:   Results for orders placed or performed during the hospital encounter of 08/29/18 (from the past 24 hour(s))  CBC     Status: Abnormal   Collection Time: 08/29/18  1:00 PM  Result Value Ref Range   WBC 7.9 4.0 - 10.5 K/uL   RBC 3.65 (L) 3.87 - 5.11 MIL/uL   Hemoglobin 10.6 (L) 12.0 - 15.0 g/dL   HCT 16.1 (L) 09.6 - 04.5 %   MCV 89.0 80.0 - 100.0 fL   MCH 29.0 26.0 - 34.0 pg   MCHC 32.6 30.0 - 36.0 g/dL   RDW 40.9 (H) 81.1 - 91.4 %   Platelets 224 150 - 400 K/uL   nRBC 0.0 0.0 - 0.2 %    Assessment : SHAWNETTA LEIN is a 32 y.o. G2P1001  at [redacted]w[redacted]d being admitted for labor induction due to PROM.  Plan: Labor: Expectant management for now. If no cervical change, can continue induction with Pitocin as ordered as per protocol. Analgesia as needed. FWB: Reassuring fetal heart tracing.  GBS negative Delivery plan: Hopeful for vaginal delivery   Hildred Laser, MD [facility]

## 2018-08-29 NOTE — Progress Notes (Signed)
ROB-Pt stated that she is doing well no complaints.  

## 2018-08-29 NOTE — Anesthesia Preprocedure Evaluation (Signed)

## 2018-08-29 NOTE — Addendum Note (Signed)
Addended by: Fabian November on: 08/29/2018 12:06 PM   Modules accepted: Orders, SmartSet

## 2018-08-29 NOTE — Anesthesia Procedure Notes (Signed)
Epidural Patient location during procedure: OB  Staffing Performed: anesthesiologist   Preanesthetic Checklist Completed: patient identified, site marked, surgical consent, pre-op evaluation, timeout performed, IV checked, risks and benefits discussed and monitors and equipment checked  Epidural Patient position: sitting Prep: Betadine Patient monitoring: heart rate, continuous pulse ox and blood pressure Approach: midline Location: L3-L4 Injection technique: LOR saline  Needle:  Needle type: Tuohy  Needle gauge: 17 G Needle length: 9 cm and 9 Needle insertion depth: 8 cm Catheter type: closed end flexible Catheter size: 19 Gauge Test dose: negative and 1.5% lidocaine with Epi 1:200 K  Assessment Sensory level: T10 Events: blood not aspirated, injection not painful, no injection resistance, negative IV test and no paresthesia  Additional Notes   Patient tolerated the insertion well without complications.-SATD -IVTD. No paresthesia. Refer to [facility] nursing for VS and dosingReason for block:procedure for pain

## 2018-08-29 NOTE — Progress Notes (Signed)
ROB: Patient doing well, no complaints.  Notes she has been having off and on contractions for the past few days, but nothing painful.  Desires membrane stripping. Cervix only 1-1.5 cm, but attempted.  Given labor precautions. RTC in 1 week.    Addendum: shortly after patient went to restroom to void after visit, she noted that her water broke in the restroom. Gross rupture on exam. Advised to go to L&D.

## 2018-08-30 LAB — CBC
HEMATOCRIT: 26.3 % — AB (ref 36.0–46.0)
Hemoglobin: 8.7 g/dL — ABNORMAL LOW (ref 12.0–15.0)
MCH: 29.6 pg (ref 26.0–34.0)
MCHC: 33.1 g/dL (ref 30.0–36.0)
MCV: 89.5 fL (ref 80.0–100.0)
NRBC: 0 % (ref 0.0–0.2)
PLATELETS: 225 10*3/uL (ref 150–400)
RBC: 2.94 MIL/uL — ABNORMAL LOW (ref 3.87–5.11)
RDW: 15.6 % — AB (ref 11.5–15.5)
WBC: 11.2 10*3/uL — AB (ref 4.0–10.5)

## 2018-08-30 LAB — RPR: RPR: NONREACTIVE

## 2018-08-30 MED ORDER — ONDANSETRON HCL 4 MG/2ML IJ SOLN: 4.0000 mg | INTRAMUSCULAR | Status: DC | PRN

## 2018-08-30 MED ORDER — ZOLPIDEM TARTRATE 5 MG PO TABS: 5.0000 mg | ORAL_TABLET | Freq: Every evening | ORAL | Status: DC | PRN

## 2018-08-30 MED ORDER — IBUPROFEN 600 MG PO TABS
600.0000 mg | ORAL_TABLET | Freq: Four times a day (QID) | ORAL | Status: DC
  Administered 2018-08-30 (×2): 600 mg via ORAL
  Filled 2018-08-30 (×2): qty 1

## 2018-08-30 MED ORDER — PRENATAL MULTIVITAMIN CH
1.0000 | ORAL_TABLET | Freq: Every day | ORAL | Status: DC
  Administered 2018-08-30 – 2018-08-31 (×2): 1 via ORAL
  Filled 2018-08-30 (×2): qty 1

## 2018-08-30 MED ORDER — ACETAMINOPHEN 325 MG PO TABS
650.0000 mg | ORAL_TABLET | ORAL | Status: DC | PRN
  Administered 2018-08-30: 650 mg via ORAL
  Filled 2018-08-30: qty 2

## 2018-08-30 MED ORDER — DOCUSATE SODIUM 100 MG PO CAPS
100.0000 mg | ORAL_CAPSULE | Freq: Two times a day (BID) | ORAL | Status: DC
  Administered 2018-08-30 – 2018-08-31 (×3): 100 mg via ORAL
  Filled 2018-08-30 (×3): qty 1

## 2018-08-30 MED ORDER — SENNOSIDES-DOCUSATE SODIUM 8.6-50 MG PO TABS: 2.0000 | ORAL_TABLET | ORAL | Status: DC

## 2018-08-30 MED ORDER — GUAIFENESIN ER 600 MG PO TB12
600.0000 mg | ORAL_TABLET | Freq: Two times a day (BID) | ORAL | Status: DC
  Administered 2018-08-30 – 2018-08-31 (×3): 600 mg via ORAL
  Filled 2018-08-30 (×4): qty 1

## 2018-08-30 MED ORDER — WITCH HAZEL-GLYCERIN EX PADS
1.0000 "application " | MEDICATED_PAD | CUTANEOUS | Status: DC | PRN
  Administered 2018-08-30: 1 via TOPICAL
  Filled 2018-08-30 (×3): qty 100

## 2018-08-30 MED ORDER — SALINE SPRAY 0.65 % NA SOLN
1.0000 | NASAL | Status: DC | PRN
  Administered 2018-08-30: 1 via NASAL
  Filled 2018-08-30: qty 44

## 2018-08-30 MED ORDER — PANTOPRAZOLE SODIUM 20 MG PO TBEC
20.0000 mg | DELAYED_RELEASE_TABLET | Freq: Two times a day (BID) | ORAL | Status: DC
  Administered 2018-08-30 – 2018-08-31 (×3): 20 mg via ORAL
  Filled 2018-08-30 (×5): qty 1

## 2018-08-30 MED ORDER — DIBUCAINE 1 % RE OINT
1.0000 "application " | TOPICAL_OINTMENT | RECTAL | Status: DC | PRN
  Administered 2018-08-30: 1 via RECTAL
  Filled 2018-08-30 (×3): qty 28

## 2018-08-30 MED ORDER — BENZOCAINE-MENTHOL 20-0.5 % EX AERO
1.0000 "application " | INHALATION_SPRAY | CUTANEOUS | Status: DC | PRN
  Filled 2018-08-30 (×2): qty 56

## 2018-08-30 MED ORDER — DIPHENHYDRAMINE HCL 25 MG PO CAPS: 25.0000 mg | ORAL_CAPSULE | Freq: Four times a day (QID) | ORAL | Status: DC | PRN

## 2018-08-30 MED ORDER — COCONUT OIL OIL: 1.0000 "application " | TOPICAL_OIL | Status: DC | PRN

## 2018-08-30 MED ORDER — ONDANSETRON HCL 4 MG PO TABS: 4.0000 mg | ORAL_TABLET | ORAL | Status: DC | PRN

## 2018-08-30 MED ORDER — KETOROLAC TROMETHAMINE 10 MG PO TABS
10.0000 mg | ORAL_TABLET | Freq: Four times a day (QID) | ORAL | Status: DC
  Administered 2018-08-30 – 2018-08-31 (×4): 10 mg via ORAL
  Filled 2018-08-30 (×9): qty 1

## 2018-08-30 MED ORDER — IBUPROFEN 600 MG PO TABS: 600.0000 mg | ORAL_TABLET | Freq: Four times a day (QID) | ORAL | Status: DC | PRN

## 2018-08-30 MED ORDER — SIMETHICONE 80 MG PO CHEW: 80.0000 mg | CHEWABLE_TABLET | ORAL | Status: DC | PRN

## 2018-08-30 MED ORDER — LACTATED RINGERS IV SOLN
INTRAVENOUS | Status: DC
  Administered 2018-08-30: 07:00:00 via INTRAVENOUS

## 2018-08-30 MED ORDER — GUAIFENESIN 100 MG/5ML PO SOLN
5.0000 mL | ORAL | Status: DC | PRN
  Administered 2018-08-31: 100 mg via ORAL
  Filled 2018-08-30 (×3): qty 5

## 2018-08-30 NOTE — Anesthesia Postprocedure Evaluation (Signed)
Anesthesia Post Note  Patient: Samantha Blackwell  Procedure(s) Performed: AN AD HOC LABOR EPIDURAL  Patient location during evaluation: Mother Baby Anesthesia Type: Epidural Level of consciousness: awake and alert Pain management: pain level controlled Vital Signs Assessment: post-procedure vital signs reviewed and stable Respiratory status: spontaneous breathing, nonlabored ventilation and respiratory function stable Cardiovascular status: stable Postop Assessment: no headache, no backache and epidural receding Anesthetic complications: no     Last Vitals:  Vitals:   08/30/18 0434 08/30/18 0724  BP: 112/74 116/74  Pulse: 78 79  Resp: 18 18  Temp: 36.5 C 36.7 C  SpO2: 100% 99%    Last Pain:  Vitals:   08/30/18 0724  TempSrc: Oral  PainSc:                  Jules Schick

## 2018-08-30 NOTE — Lactation Note (Signed)
This note was copied from a baby's chart. Lactation Consultation Note  Patient Name: Girl Louvina Cleary Today's Date: 08/30/2018 Reason for consult: Initial assessment   Maternal Data Formula Feeding for Exclusion: No Has patient been taught Hand Expression?: Yes Does the patient have breastfeeding experience prior to this delivery?: Yes  Feeding Feeding Type: Breast Fed  Mother is on the fence about breast feeding. Sh related that her first breast feeding experience did not go well. A barrier this time is nipple soreness and  infant with restrictive tongue movement.              Interventions    Lactation Tools Discussed/Used Tools: Nipple Shields Nipple shield size: 20(lactation to room, mom states is painful while breastfeeding)   Consult Status      Gilman Schmidt Eagan Shifflett 08/30/2018, 11:05 AM

## 2018-08-30 NOTE — Progress Notes (Signed)
Post Partum Day # 1, s/p VAVD  Subjective: no complaints, up ad lib, voiding and tolerating PO.  Desires something stronger for cramping than Motrin.  Objective: Temp:  [97.7 F (36.5 C)-98.3 F (36.8 C)] 98 F (36.7 C) (10/17 0724) Pulse Rate:  [67-104] 79 (10/17 0724) Resp:  [18] 18 (10/17 0724) BP: (112-133)/(55-98) 116/74 (10/17 0724) SpO2:  [99 %-100 %] 99 % (10/17 0724) Weight:  [88.9 kg] 88.9 kg (10/16 1339)  Physical Exam:  General: alert and no distress  Lungs: clear to auscultation bilaterally Breasts: normal appearance, no masses or tenderness Heart: regular rate and rhythm, S1, S2 normal, no murmur, click, rub or gallop Abdomen: soft, non-tender; bowel sounds normal; no masses,  no organomegaly Pelvis: Lochia: appropriate. Moderate vulvar swelling. Uterine Fundus: firm Extremities: DVT Evaluation: No evidence of DVT seen on physical exam. Negative Homan's sign. No cords or calf tenderness. No significant calf/ankle edema.  Recent Labs    08/29/18 1300  HGB 10.6*  HCT 32.5*    Assessment/Plan: Doing well postpartum Postpartum CBC ordered.  Continue ice packs for vulvar edema.  Mild anemia of pregnancy, will treat with PO iron supplementation.  Breastfeeding  Contraception undecided. Plan for discharge tomorrow   LOS: 1 day   Hildred Laser, MD [facility] 08/30/2018 8:14 AM

## 2018-08-31 MED ORDER — IBUPROFEN 800 MG PO TABS: 800.0000 mg | ORAL_TABLET | Freq: Three times a day (TID) | ORAL | 1 refills | Status: DC | PRN

## 2018-08-31 MED ORDER — DOCUSATE SODIUM 100 MG PO CAPS: 100.0000 mg | ORAL_CAPSULE | Freq: Two times a day (BID) | ORAL | 1 refills | Status: DC | PRN

## 2018-08-31 MED ORDER — KETOROLAC TROMETHAMINE 10 MG PO TABS: 10.0000 mg | ORAL_TABLET | Freq: Four times a day (QID) | ORAL | 0 refills | Status: DC | PRN

## 2018-08-31 MED ORDER — SENNOSIDES-DOCUSATE SODIUM 8.6-50 MG PO TABS
2.0000 | ORAL_TABLET | ORAL | Status: DC
  Administered 2018-08-31: 2 via ORAL
  Filled 2018-08-31: qty 2

## 2018-08-31 MED ORDER — FERROUS SULFATE 325 (65 FE) MG PO TABS: 325.0000 mg | ORAL_TABLET | Freq: Every day | ORAL | 1 refills | Status: DC

## 2018-08-31 NOTE — Progress Notes (Signed)
Dc to home with NB via auxillary.  To car via wheelchair

## 2018-08-31 NOTE — Discharge Summary (Addendum)
OB Discharge Summary     Patient Name: Samantha Blackwell DOB: 07-18-1986 MRN: [id number]  Date of admission: 08/29/2018 Delivering MD: Hildred Laser   Date of discharge: 08/31/2018  Admitting diagnosis: PROM Intrauterine pregnancy: [redacted]w[redacted]d     Secondary diagnosis:  Gastroesophageal reflux disease, esophagitis presence not specified   Additional problems: Anemia of pregnancy     Discharge diagnosis: Term Pregnancy Delivered and Anemia                                                                                                Post partum procedures:None  Augmentation: Pitocin  Complications: None  Hospital course:  Induction of Labor With Vaginal Delivery   32 y.o. yo G2P1001 at [redacted]w[redacted]d was admitted to the hospital 08/29/2018 for induction of labor.  Indication for induction: PROM.  Patient had an uncomplicated labor course as follows: Membrane Rupture Time/Date: 10/15/20019, 10:30 a.m. Intrapartum Procedures: Episiotomy: None [1]                                         Lacerations:  1st degree [2];Vaginal [6]  Patient had delivery of a Viable infant.  Information for the patient's newborn:  Shermika, [place] Girl Madden [[id number]]  Delivery Method: Vag-Vacuum   08/29/2018  Details of delivery can be found in separate delivery note.  Patient had a routine postpartum course. Patient is discharged home 08/31/18.  Physical exam  Vitals:   08/30/18 1110 08/30/18 1550 08/30/18 2350 08/31/18 0806  BP: 113/78 129/89 110/72 109/75  Pulse: 78 80 94 69  Resp: 18 18 18 18   Temp: 98.7 F (37.1 C) 98 F (36.7 C) 98.4 F (36.9 C) 98.6 F (37 C)  TempSrc: Oral Oral Oral Oral  SpO2:  100% 96% 97%  Weight:      Height:       General: alert and no distress Lochia: appropriate Uterine Fundus: firm Incision: N/A DVT Evaluation: No evidence of DVT seen on physical exam. Negative Homan's sign. No cords or calf tenderness. No significant calf/ankle edema.  Labs: Lab Results  Component  Value Date   WBC 11.2 (H) 08/30/2018   HGB 8.7 (L) 08/30/2018   HCT 26.3 (L) 08/30/2018   MCV 89.5 08/30/2018   PLT 225 08/30/2018   CMP Latest Ref Rng & Units 06/07/2018  Glucose 70 - 99 mg/dL 91  BUN 6 - 20 mg/dL 7  Creatinine 7.82 - 9.56 mg/dL 2.13  Sodium 086 - 578 mmol/L 138  Potassium 3.5 - 5.1 mmol/L 3.4(L)  Chloride 98 - 111 mmol/L 107  CO2 22 - 32 mmol/L 23  Calcium 8.9 - 10.3 mg/dL 4.6(N)  Total Protein 6.0 - 8.5 g/dL -  Total Bilirubin 0.0 - 1.2 mg/dL -  Alkaline Phos 39 - 629 IU/L -  AST 0 - 40 IU/L -  ALT 0 - 32 IU/L -    Discharge instruction: per After Visit Summary and "Baby and Me Booklet".  After visit meds:  Allergies as of 08/31/2018  Reactions   Penicillins Rash      Medication List    TAKE these medications   cetirizine 10 MG tablet Commonly known as:  ZYRTEC Take 10 mg by mouth daily.   docusate sodium 100 MG capsule Commonly known as:  COLACE Take 1 capsule (100 mg total) by mouth 2 (two) times daily as needed for mild constipation.   FERRALET 90 90-1 MG Tabs Take 1 tablet by mouth daily.   ferrous sulfate 325 (65 FE) MG tablet Take 1 tablet (325 mg total) by mouth daily with breakfast.   fluticasone 50 MCG/ACT nasal spray Commonly known as:  FLONASE Place 2 sprays into both nostrils daily.   ibuprofen 800 MG tablet Commonly known as:  ADVIL,MOTRIN Take 1 tablet (800 mg total) by mouth every 8 (eight) hours as needed.   ketorolac 10 MG tablet Commonly known as:  TORADOL Take 1 tablet (10 mg total) by mouth every 6 (six) hours as needed. Do not take within 6 hrs of Motrin   pantoprazole 20 MG tablet Commonly known as:  PROTONIX Take 1 tablet (20 mg total) by mouth 2 (two) times daily.   prenatal vitamin w/FE, FA 29-1 MG Chew chewable tablet Chew 1 tablet by mouth daily at 12 noon.       Diet: routine diet  Activity: Advance as tolerated. Pelvic rest for 6 weeks.   Outpatient follow up:6 weeks Follow up Appt: Future  Appointments  Date Time Provider Department Center  09/06/2018  8:45 AM Linzie Collin, MD [facility] None  09/21/2018  1:00 PM [facility]  09/24/2018 10:45 AM Sondra Come, MD [facility] None  10/04/2018  8:00 AM Hildred Laser, MD [facility] None   Follow up Visit:No follow-ups on file.  Postpartum contraception: Undecided  Newborn Data: Live born female  Birth Weight: 8 lb 3.9 oz (3740 g) APGAR: 8, 9  Newborn Delivery   Birth date/time:  08/29/2018 23:18:00 Delivery type:  Vaginal, Vacuum (Extractor)     Baby Feeding: Breast Disposition:home with mother   08/31/2018 Hildred Laser, MD

## 2018-08-31 NOTE — Progress Notes (Signed)
   08/31/18 0800  Clinical Encounter Type  Visited With Patient and family together  Visit Type Initial;Spiritual support  Recommendations Follow-up as needed.  Spiritual Encounters  Spiritual Needs Emotional;Prayer  Stress Factors  Patient Stress Factors  (She's ready to go home.)   Chaplain provided emotional support and prayer.

## 2018-09-03 ENCOUNTER — Other Ambulatory Visit: Payer: Self-pay

## 2018-09-03 ENCOUNTER — Inpatient Hospital Stay: Admit: 2018-09-03 | Payer: Self-pay

## 2018-09-03 ENCOUNTER — Telehealth: Payer: Self-pay | Admitting: Lactation Services

## 2018-09-03 ENCOUNTER — Telehealth: Payer: Self-pay | Admitting: Obstetrics and Gynecology

## 2018-09-03 MED ORDER — CEPHALEXIN 500 MG PO CAPS: 500.0000 mg | ORAL_CAPSULE | Freq: Three times a day (TID) | ORAL | 0 refills | Status: DC

## 2018-09-03 NOTE — Telephone Encounter (Signed)
Pt called back and stated that she was having a lot of pain in her breast, redness, swelling, and both breast were hard. Pt was advise to try wrapping both breast in ace bandage and to use bags of peas(frozen) to place on the breast to dry up the milk. Pt was informed that an antibiotic was called in for her.

## 2018-09-03 NOTE — Telephone Encounter (Signed)
Patient called stating she has mastitis. She delivered last week. She was hoping something could just be called in. She uses the [employer]. Thanks

## 2018-09-03 NOTE — Telephone Encounter (Signed)
Pt called no answer LM to call the office to speak more about her concerns for calling the office. 

## 2018-09-06 ENCOUNTER — Encounter: Payer: 59 | Admitting: Obstetrics and Gynecology

## 2018-09-12 ENCOUNTER — Encounter: Payer: Self-pay | Admitting: Obstetrics and Gynecology

## 2018-09-12 ENCOUNTER — Ambulatory Visit (INDEPENDENT_AMBULATORY_CARE_PROVIDER_SITE_OTHER): Payer: 59 | Admitting: Obstetrics and Gynecology

## 2018-09-12 VITALS — BP 122/84 | HR 116 | Ht 64.0 in | Wt 165.6 lb

## 2018-09-12 DIAGNOSIS — O9122 Nonpurulent mastitis associated with the puerperium: Secondary | ICD-10-CM

## 2018-09-12 NOTE — Progress Notes (Signed)
Pt called about 2 weeks ago stating that her breast was really red, swollen and hurting. Pt was prescribed antibiotic for the infection in her breast. Pt is present today for a follow up to have her breast checked. Pt stated that her breast are feeling better.

## 2018-09-12 NOTE — Progress Notes (Signed)
    GYNECOLOGY PROGRESS NOTE  Subjective:    Patient ID: Samantha Blackwell, female    DOB: 02-Apr-1986, 32 y.o.   MRN: [id number]  HPI  Patient is a 32 y.o. G65P1001 female who presents for follow up of recent mastitis episode. Patient notes that she was having significant breast engorgement with redness around both breasts and significant tenderness. Notes that she has noticed a significant improvement since starting antibiotics.  She denies any fevers or chills currently. Is not breastfeeding. Has used cabbage leaves and tight-fitting bras. Still notes occasionally leakjng.  The following portions of the patient's history were reviewed and updated as appropriate: allergies, current medications, past family history, past medical history, past social history, past surgical history and problem list.  Review of Systems Pertinent items noted in HPI and remainder of comprehensive ROS otherwise negative.   Objective:   Blood pressure 122/84, pulse (!) 116, height 5\' 4"  (1.626 m), weight 165 lb 9.6 oz (75.1 kg), last menstrual period 11/25/2017. General appearance: alert and no distress Breasts: normal appearance, no masses or tenderness. Mild cracking and peeling of right areola.  Assessment:   Postpartum mastitis  Plan:   Mastitis - continue antibiotics until completion Advised on nipple cream or coconut oil for areola cracking.  RTC in 4 weeks for postpartum visit.    Hildred Laser, MD [facility]

## 2018-09-21 ENCOUNTER — Ambulatory Visit: Payer: 59

## 2018-09-21 ENCOUNTER — Ambulatory Visit
Admission: RE | Admit: 2018-09-21 | Discharge: 2018-09-21 | Disposition: A | Discharge status: Home / Self Care | Payer: 59 | Source: Ambulatory Visit | Attending: Urology | Admitting: Urology

## 2018-09-21 DIAGNOSIS — N200 Calculus of kidney: Secondary | ICD-10-CM

## 2018-09-21 DIAGNOSIS — N1339 Other hydronephrosis: Secondary | ICD-10-CM

## 2018-09-21 DIAGNOSIS — N1330 Unspecified hydronephrosis: Secondary | ICD-10-CM | POA: Diagnosis not present

## 2018-09-24 ENCOUNTER — Encounter: Payer: Self-pay | Admitting: Urology

## 2018-09-24 ENCOUNTER — Ambulatory Visit (INDEPENDENT_AMBULATORY_CARE_PROVIDER_SITE_OTHER): Payer: 59 | Admitting: Urology

## 2018-09-24 VITALS — BP 114/81 | HR 64 | Ht 64.0 in | Wt 163.1 lb

## 2018-09-24 DIAGNOSIS — N200 Calculus of kidney: Secondary | ICD-10-CM

## 2018-09-24 LAB — MICROSCOPIC EXAMINATION
RBC, UA: NONE SEEN /hpf (ref 0–2)
WBC UA: NONE SEEN /HPF (ref 0–5)

## 2018-09-24 LAB — URINALYSIS, COMPLETE
Bilirubin, UA: NEGATIVE
GLUCOSE, UA: NEGATIVE
Ketones, UA: NEGATIVE
LEUKOCYTES UA: NEGATIVE
Nitrite, UA: NEGATIVE
PH UA: 6.5 (ref 5.0–7.5)
PROTEIN UA: NEGATIVE
Specific Gravity, UA: 1.01 (ref 1.005–1.030)
UUROB: 0.2 mg/dL (ref 0.2–1.0)

## 2018-09-24 NOTE — Progress Notes (Signed)
   09/24/2018 5:59 PM   Samantha Blackwell 1985-12-06 [id number]  Reason for visit: Follow up nephrolithiasis  HPI: I met Samantha Blackwell in urology clinic today for the first time.  She is a healthy 32 year old female that was previously followed by Dr. Junious Silk when she was found to have hydronephrosis on the right side during pregnancy with possible ureteral stones.  Her flank pain has completely resolved after her pregnancy.  Follow-up renal ultrasound 09/21/2018 showed complete resolution of hydronephrosis, and possible left 4 mm nonobstructing left renal calculus.  She denies any recent stone passage or hematuria.  She reports approximately 5 stone episodes in the past, all of which passed spontaneously.  There are no aggravating or alleviating factors.  Severity is mild.  ROS: Please see flowsheet from today's date for complete review of systems.  Physical Exam: BP 114/81 (BP Location: Right Arm, Patient Position: Sitting)   Pulse 64   Ht '5\' 4"'$  (1.626 m)   Wt 163 lb 1.6 oz (74 kg)   LMP 11/25/2017   BMI 28.00 kg/m    No CVA tenderness  Calcium 8.4  Assessment & Plan:   In summary, Samantha Blackwell is a 32 year old female with history of 5 spontaneously passed stones.  She is currently asymptomatic with no hydronephrosis or obstructing stones on renal ultrasound.  We discussed general stone prevention strategies including adequate hydration with goal of producing 2.5 L of urine daily, increasing citric acid intake, increasing calcium intake during high oxalate meals, minimizing animal protein, and decreasing salt intake. Information about dietary recommendations given today.   With her young age and recurrent stone disease, I did recommend 24-hour urine collection.  Follow-up in 4 to 6 weeks to discuss 24-hour urine results  15 minutes were spent with the patient today, greater than than 50% were spent in direct face-to-face patient education and counseling regarding nephrolithiasis and  recurrent stone disease.  Billey Co, [facility] [address] [place], [place] [address] [phone]

## 2018-10-04 ENCOUNTER — Encounter: Payer: 59 | Admitting: Obstetrics and Gynecology

## 2018-10-04 ENCOUNTER — Encounter: Payer: Self-pay | Admitting: Obstetrics and Gynecology

## 2018-10-04 ENCOUNTER — Ambulatory Visit (INDEPENDENT_AMBULATORY_CARE_PROVIDER_SITE_OTHER): Payer: 59 | Admitting: Obstetrics and Gynecology

## 2018-10-04 DIAGNOSIS — Z1389 Encounter for screening for other disorder: Secondary | ICD-10-CM

## 2018-10-04 DIAGNOSIS — Z30011 Encounter for initial prescription of contraceptive pills: Secondary | ICD-10-CM

## 2018-10-04 DIAGNOSIS — O9081 Anemia of the puerperium: Secondary | ICD-10-CM

## 2018-10-04 MED ORDER — NORETHIN-ETH ESTRAD-FE BIPHAS 1 MG-10 MCG / 10 MCG PO TABS: 1.0000 | ORAL_TABLET | Freq: Every day | ORAL | 11 refills | Status: DC

## 2018-10-04 NOTE — Progress Notes (Signed)
   OBSTETRICS POSTPARTUM CLINIC PROGRESS NOTE  Subjective:     Samantha Blackwell is a 32 y.o. 532P1001 female who presents for a postpartum visit. She is 5 weeks postpartum following a spontaneous vaginal delivery. I have fully reviewed the prenatal and intrapartum course. The delivery was at 39 gestational weeks.  Anesthesia: epidural. Postpartum course has been well. Baby's course has been well. Baby is feeding by bottle - Similac Soy. Bleeding: patient has not resumed menses. Bowel function is normal. Bladder function is normal. Patient is not sexually active. Contraception method desired is OCP (estrogen/progesterone). Postpartum depression screening: negative (EPDS = 2).   The following portions of the patient's history were reviewed and updated as appropriate: allergies, current medications, past family history, past medical history, past social history, past surgical history and problem list.  Review of Systems Pertinent items noted in HPI and remainder of comprehensive ROS otherwise negative.   Objective:    BP 102/68   Pulse 67   Wt 163 lb 1.6 oz (74 kg)   LMP 11/25/2017   BMI 28.00 kg/m   General:  alert and no distress   Breasts:  inspection negative, no nipple discharge or bleeding, no masses or nodularity palpable  Lungs: clear to auscultation bilaterally  Heart:  regular rate and rhythm, S1, S2 normal, no murmur, click, rub or gallop  Abdomen: soft, non-tender; bowel sounds normal; no masses,  no organomegaly.    Vulva:  normal  Vagina: normal vagina, no discharge, exudate, lesion, or erythema  Cervix:  no cervical motion tenderness and no lesions  Corpus: normal size, contour, position, consistency, mobility, non-tender  Adnexa:  normal adnexa and no mass, fullness, tenderness  Rectal Exam: Not performed.         Labs:  Lab Results  Component Value Date   HGB 8.7 (L) 08/30/2018    Assessment:    Routine postpartum exam.    Postpartum anemia Contraception  management  Plan:    1. Contraception: OCP (estrogen/progesterone).  Given samples of Lo-Loestrin (desires lowest dose hormone pill).  2. Will check Hgb for h/o anemia.  3. Follow up in: 6 months for annual exam, or sooner as needed.    Hildred Laserherry, Tuana Hoheisel, MD [facility]

## 2018-10-04 NOTE — Progress Notes (Signed)
   PT is present today for her postpartum visit. Pt stated that she is not breastfeeding and have not had sexually intercourse recently. Pt stated that she would like to have pills for birth control. EPDS= 2.  Pt stated that she is doing well no complaints.

## 2018-11-15 ENCOUNTER — Ambulatory Visit: Payer: 59 | Admitting: Urology

## 2018-11-23 ENCOUNTER — Other Ambulatory Visit: Payer: Self-pay | Admitting: Obstetrics and Gynecology

## 2019-04-15 ENCOUNTER — Encounter: Payer: 59 | Admitting: Obstetrics and Gynecology

## 2019-05-01 ENCOUNTER — Encounter: Payer: 59 | Admitting: Obstetrics and Gynecology

## 2019-07-29 NOTE — Patient Instructions (Addendum)
Health Maintenance, Female Adopting a healthy lifestyle and getting preventive care are important in promoting health and wellness. Ask your health care provider about:  The right schedule for you to have regular tests and exams.  Things you can do on your own to prevent diseases and keep yourself healthy. What should I know about diet, weight, and exercise? Eat a healthy diet   Eat a diet that includes plenty of vegetables, fruits, low-fat dairy products, and lean protein.  Do not eat a lot of foods that are high in solid fats, added sugars, or sodium. Maintain a healthy weight Body mass index (BMI) is used to identify weight problems. It estimates body fat based on height and weight. Your health care provider can help determine your BMI and help you achieve or maintain a healthy weight. Get regular exercise Get regular exercise. This is one of the most important things you can do for your health. Most adults should:  Exercise for at least 150 minutes each week. The exercise should increase your heart rate and make you sweat (moderate-intensity exercise).  Do strengthening exercises at least twice a week. This is in addition to the moderate-intensity exercise.  Spend less time sitting. Even light physical activity can be beneficial. Watch cholesterol and blood lipids Have your blood tested for lipids and cholesterol at 31 years of age, then have this test every 5 years. Have your cholesterol levels checked more often if:  Your lipid or cholesterol levels are high.  You are older than 31 years of age.  You are at high risk for heart disease. What should I know about cancer screening? Depending on your health history and family history, you may need to have cancer screening at various ages. This may include screening for:  Breast cancer.  Cervical cancer.  Colorectal cancer.  Skin cancer.  Lung cancer. What should I know about heart disease, diabetes, and high blood  pressure? Blood pressure and heart disease  High blood pressure causes heart disease and increases the risk of stroke. This is more likely to develop in people who have high blood pressure readings, are of African descent, or are overweight.  Have your blood pressure checked: ? Every 3-5 years if you are 18-39 years of age. ? Every year if you are 40 years old or older. Diabetes Have regular diabetes screenings. This checks your fasting blood sugar level. Have the screening done:  Once every three years after age 40 if you are at a normal weight and have a low risk for diabetes.  More often and at a younger age if you are overweight or have a high risk for diabetes. What should I know about preventing infection? Hepatitis B If you have a higher risk for hepatitis B, you should be screened for this virus. Talk with your health care provider to find out if you are at risk for hepatitis B infection. Hepatitis C Testing is recommended for:  Everyone born from 1945 through 1965.  Anyone with known risk factors for hepatitis C. Sexually transmitted infections (STIs)  Get screened for STIs, including gonorrhea and chlamydia, if: ? You are sexually active and are younger than 31 years of age. ? You are older than 31 years of age and your health care provider tells you that you are at risk for this type of infection. ? Your sexual activity has changed since you were last screened, and you are at increased risk for chlamydia or gonorrhea. Ask your health care provider if   you are at risk.  Ask your health care provider about whether you are at high risk for HIV. Your health care provider may recommend a prescription medicine to help prevent HIV infection. If you choose to take medicine to prevent HIV, you should first get tested for HIV. You should then be tested every 3 months for as long as you are taking the medicine. Pregnancy  If you are about to stop having your period (premenopausal) and  you may become pregnant, seek counseling before you get pregnant.  Take 400 to 800 micrograms (mcg) of folic acid every day if you become pregnant.  Ask for birth control (contraception) if you want to prevent pregnancy. Osteoporosis and menopause Osteoporosis is a disease in which the bones lose minerals and strength with aging. This can result in bone fractures. If you are 65 years old or older, or if you are at risk for osteoporosis and fractures, ask your health care provider if you should:  Be screened for bone loss.  Take a calcium or vitamin D supplement to lower your risk of fractures.  Be given hormone replacement therapy (HRT) to treat symptoms of menopause. Follow these instructions at home: Lifestyle  Do not use any products that contain nicotine or tobacco, such as cigarettes, e-cigarettes, and chewing tobacco. If you need help quitting, ask your health care provider.  Do not use street drugs.  Do not share needles.  Ask your health care provider for help if you need support or information about quitting drugs. Alcohol use  Do not drink alcohol if: ? Your health care provider tells you not to drink. ? You are pregnant, may be pregnant, or are planning to become pregnant.  If you drink alcohol: ? Limit how much you use to 0-1 drink a day. ? Limit intake if you are breastfeeding.  Be aware of how much alcohol is in your drink. In the [place] one drink equals one 12 oz bottle of beer (355 mL), one 5 oz glass of wine (148 mL), or one 1 oz glass of hard liquor (44 mL). General instructions  Schedule regular health, dental, and eye exams.  Stay current with your vaccines.  Tell your health care provider if: ? You often feel depressed. ? You have ever been abused or do not feel safe at home. Summary  Adopting a healthy lifestyle and getting preventive care are important in promoting health and wellness.  Follow your health care provider's instructions about healthy  diet, exercising, and getting tested or screened for diseases.  Follow your health care provider's instructions on monitoring your cholesterol and blood pressure. This information is not intended to replace advice given to you by your health care provider. Make sure you discuss any questions you have with your health care provider. Document Released: 05/16/2011 Document Revised: 10/24/2018 Document Reviewed: 10/24/2018 Elsevier Patient Education  2020 Elsevier Inc.   Breast Self-Awareness Breast self-awareness is knowing how your breasts look and feel. Doing breast self-awareness is important. It allows you to catch a breast problem early while it is still small and can be treated. All women should do breast self-awareness, including women who have had breast implants. Tell your doctor if you notice a change in your breasts. What you need:  A mirror.  A well-lit room. How to do a breast self-exam A breast self-exam is one way to learn what is normal for your breasts and to check for changes. To do a breast self-exam: Look for changes  1.   Take off all the clothes above your waist. 2. Stand in front of a mirror in a room with good lighting. 3. Put your hands on your hips. 4. Push your hands down. 5. Look at your breasts and nipples in the mirror to see if one breast or nipple looks different from the other. Check to see if: ? The shape of one breast is different. ? The size of one breast is different. ? There are wrinkles, dips, and bumps in one breast and not the other. 6. Look at each breast for changes in the skin, such as: ? Redness. ? Scaly areas. 7. Look for changes in your nipples, such as: ? Liquid around the nipples. ? Bleeding. ? Dimpling. ? Redness. ? A change in where the nipples are. Feel for changes  1. Lie on your back on the floor. 2. Feel each breast. To do this, follow these steps: ? Pick a breast to feel. ? Put the arm closest to that breast above your head.  ? Use your other arm to feel the nipple area of your breast. Feel the area with the pads of your three middle fingers by making small circles with your fingers. For the first circle, press lightly. For the second circle, press harder. For the third circle, press even harder. ? Keep making circles with your fingers at the different pressures as you move down your breast. Stop when you feel your ribs. ? Move your fingers a little toward the center of your body. ? Start making circles with your fingers again, this time going up until you reach your collarbone. ? Keep making up-and-down circles until you reach your armpit. Remember to keep using the three pressures. ? Feel the other breast in the same way. 3. Sit or stand in the tub or shower. 4. With soapy water on your skin, feel each breast the same way you did in step 2 when you were lying on the floor. Write down what you find Writing down what you find can help you remember what to tell your doctor. Write down:  What is normal for each breast.  Any changes you find in each breast, including: ? The kind of changes you find. ? Whether you have pain. ? Size and location of any lumps.  When you last had your menstrual period. General tips  Check your breasts every month.  If you are breastfeeding, the best time to check your breasts is after you feed your baby or after you use a breast pump.  If you get menstrual periods, the best time to check your breasts is 5-7 days after your menstrual period is over.  With time, you will become comfortable with the self-exam, and you will begin to know if there are changes in your breasts. Contact a doctor if you:  See a change in the shape or size of your breasts or nipples.  See a change in the skin of your breast or nipples, such as red or scaly skin.  Have fluid coming from your nipples that is not normal.  Find a lump or thick area that was not there before.  Have pain in your breasts.   Have any concerns about your breast health. Summary  Breast self-awareness includes looking for changes in your breasts, as well as feeling for changes within your breasts.  Breast self-awareness should be done in front of a mirror in a well-lit room.  You should check your breasts every month. If you get menstrual   periods, the best time to check your breasts is 5-7 days after your menstrual period is over.  Let your doctor know of any changes you see in your breasts, including changes in size, changes on the skin, pain or tenderness, or fluid from your nipples that is not normal. This information is not intended to replace advice given to you by your health care provider. Make sure you discuss any questions you have with your health care provider. Document Released: 04/18/2008 Document Revised: 06/19/2018 Document Reviewed: 06/19/2018 Elsevier Patient Education  2020 Elsevier Inc.  

## 2019-07-29 NOTE — Progress Notes (Signed)
Pt is present for annual exam. Pt stated that she was doing well but having some issues with acid reflux no other complaints.

## 2019-07-30 ENCOUNTER — Ambulatory Visit (INDEPENDENT_AMBULATORY_CARE_PROVIDER_SITE_OTHER): Payer: 59 | Admitting: Obstetrics and Gynecology

## 2019-07-30 ENCOUNTER — Other Ambulatory Visit: Payer: Self-pay

## 2019-07-30 ENCOUNTER — Encounter: Payer: Self-pay | Admitting: Obstetrics and Gynecology

## 2019-07-30 VITALS — BP 98/65 | HR 76 | Ht 64.0 in | Wt 172.9 lb

## 2019-07-30 DIAGNOSIS — Z13220 Encounter for screening for lipoid disorders: Secondary | ICD-10-CM

## 2019-07-30 DIAGNOSIS — Z01419 Encounter for gynecological examination (general) (routine) without abnormal findings: Secondary | ICD-10-CM | POA: Diagnosis not present

## 2019-07-30 DIAGNOSIS — E663 Overweight: Secondary | ICD-10-CM | POA: Diagnosis not present

## 2019-07-30 DIAGNOSIS — K219 Gastro-esophageal reflux disease without esophagitis: Secondary | ICD-10-CM

## 2019-07-30 DIAGNOSIS — Z3041 Encounter for surveillance of contraceptive pills: Secondary | ICD-10-CM | POA: Diagnosis not present

## 2019-07-30 DIAGNOSIS — Z803 Family history of malignant neoplasm of breast: Secondary | ICD-10-CM

## 2019-07-30 MED ORDER — PANTOPRAZOLE SODIUM 20 MG PO TBEC: 20.0000 mg | DELAYED_RELEASE_TABLET | Freq: Two times a day (BID) | ORAL | 3 refills | Status: DC

## 2019-07-30 NOTE — Progress Notes (Signed)
GYNECOLOGY ANNUAL PHYSICAL EXAM PROGRESS NOTE Subjective:    Samantha Blackwell is a 33 y.o. G25P1001 female who presents for an annual exam. The patient has no complaints today. The patient is sexually active, married. The patient wears seatbelts: yes. The patient participates in regular exercise: no. Has the patient ever been transfused or tattooed?: no. The patient reports that there is not domestic violence in her life.    Patient notes that her child may have a tethered cord, has had difficulties reaching milestones, is just now beginning to crawl although she is ~ 33 year of age.  Has been followed by her Pediatrician and Neurology specialists. Is also doing physical therapy.    Menstrual History: Menarche age: 49 Patient's last menstrual period was 07/09/2019.  Menses are regular, light to moderate flow, lasting 4-5 days. Moderate dysmenorrhea  Denies history of STIs.  Contraception: combined OCPs Last pap smear: 10/03/2017. Abnormal pap smears    OB History  Gravida Para Term Preterm AB Living  2 2 2     2   SAB TAB Ectopic Multiple Live Births        0 2    # Outcome Date GA Lbr Len/2nd Weight Sex Delivery Anes PTL Lv  2 Term 08/29/18 [redacted]w[redacted]d  8 lb 3.9 oz (3.74 kg) F Vag-Vacuum EPI N LIV  1 Term 04/26/15 [redacted]w[redacted]d 03:01 / 00:51 7 lb 5.8 oz (3.34 kg) M Vag-Spont None  LIV     Past Medical History:  Diagnosis Date  . Dysmenorrhea   . GERD (gastroesophageal reflux disease)   . History of kidney stones 05/2015  . History of ovarian cyst     Family History  Problem Relation Age of Onset  . Prostate cancer Father   . Cancer Maternal Grandmother        Breast cancer(reoccurence in contralateral breast in aug 2018) , ovarian cancer (sept 2018)  . Breast cancer Maternal Grandmother   . Breast cancer Maternal Aunt   . Bladder Cancer Neg Hx   . Kidney cancer Neg Hx     Past Surgical History:  Procedure Laterality Date  . NO PAST SURGERIES      Social History    Socioeconomic History  . Marital status: Married    Spouse name: Not on file  . Number of children: Not on file  . Years of education: Not on file  . Highest education level: Not on file  Occupational History  . Not on file  Social Needs  . Financial resource strain: Not on file  . Food insecurity    Worry: Not on file    Inability: Not on file  . Transportation needs    Medical: Not on file    Non-medical: Not on file  Tobacco Use  . Smoking status: Never Smoker  . Smokeless tobacco: Never Used  Substance and Sexual Activity  . Alcohol use: No    Alcohol/week: 0.0 standard drinks  . Drug use: No  . Sexual activity: Yes    Birth control/protection: Pill  Lifestyle  . Physical activity    Days per week: Not on file    Minutes per session: Not on file  . Stress: Not on file  Relationships  . Social Herbalist on phone: Not on file    Gets together: Not on file    Attends religious service: Not on file    Active member of club or organization: Not on file    Attends  meetings of clubs or organizations: Not on file    Relationship status: Not on file  . Intimate partner violence    Fear of current or ex partner: Not on file    Emotionally abused: Not on file    Physically abused: Not on file    Forced sexual activity: Not on file  Other Topics Concern  . Not on file  Social History Narrative  . Not on file    Current Outpatient Medications on File Prior to Visit  Medication Sig Dispense Refill  . cetirizine (ZYRTEC) 10 MG tablet Take 10 mg by mouth daily.    . fluticasone (FLONASE) 50 MCG/ACT nasal spray Place 2 sprays into both nostrils daily. 16 g 6  . Norethindrone-Ethinyl Estradiol-Fe Biphas (LO LOESTRIN FE) 1 MG-10 MCG / 10 MCG tablet Take 1 tablet by mouth daily. 1 Package 11  . pantoprazole (PROTONIX) 20 MG tablet TAKE 1 TABLET BY MOUTH TWO TIMES DAILY 60 tablet 3   No current facility-administered medications on file prior to visit.      Allergies  Allergen Reactions  . Penicillins Rash     Review of Systems  Constitutional: negative for chills, fatigue, fevers and sweats Eyes: negative for irritation, redness and visual disturbance Ears, nose, mouth, throat, and face: negative for hearing loss, nasal congestion, snoring and tinnitus Respiratory: negative for asthma, cough, sputum Cardiovascular: negative for chest pain, dyspnea, exertional chest pressure/discomfort, irregular heart beat, palpitations and syncope Gastrointestinal: negative for abdominal pain, change in bowel habits, nausea and vomiting Genitourinary: negative for abnormal menstrual periods, genital lesions, sexual problems and vaginal discharge, dysuria and urinary incontinence Integument/breast: negative for breast lump, breast tenderness and nipple discharge Hematologic/lymphatic: negative for bleeding and easy bruising Musculoskeletal:negative for back pain and muscle weakness Neurological: negative for dizziness, headaches, vertigo and weakness Endocrine: negative for diabetic symptoms including polydipsia, polyuria and skin dryness Allergic/Immunologic: negative for hay fever and urticaria    Objective:    BP 98/65   Pulse 76   Ht 5\' 4"  (1.626 m)   Wt 172 lb 14.4 oz (78.4 kg)   LMP 07/09/2019 (LMP Unknown)   BMI 29.68 kg/m .  Body mass index is 29.68 kg/m.     General Appearance:    Alert, cooperative, no distress, appears stated age, overweight  Head:    Normocephalic, without obvious abnormality, atraumatic  Eyes:    PERRL, conjunctiva/corneas clear, EOM's intact, both eyes  Ears:    Normal external ear canals, both ears  Nose:   Nares normal, septum midline, mucosa normal, no drainage or sinus tenderness  Throat:   Lips, mucosa, and tongue normal; teeth and gums normal  Neck:   Supple, symmetrical, trachea midline, no adenopathy; thyroid: no enlargement/tenderness/nodules; no carotid bruit or JVD  Back:     Symmetric, no curvature,  ROM normal, no CVA tenderness  Lungs:     Clear to auscultation bilaterally, respirations unlabored  Chest Wall:    No tenderness or deformity   Heart:    Regular rate and rhythm, S1 and S2 normal, no murmur, rub or gallop  Breast Exam:    No tenderness, masses, or nipple abnormality  Abdomen:     Soft, non-tender, bowel sounds active all four quadrants, no masses, no organomegaly.   Genitalia:    Pelvic:external genitalia normal, vagina with lesions, discharge, or tenderness, rectovaginal septum  normal. Cervix normal in appearance, no cervical motion tenderness; uterus normal size, shape, consistency, mobile; no adnexal masses or tenderness.  Rectal:    Normal external sphincter.  No hemorrhoids appreciated. Internal exam not done.   Extremities:   Extremities normal, atraumatic, no cyanosis or edema  Pulses:   2+ and symmetric all extremities  Skin:   Skin color, texture, turgor normal, no rashes or lesions  Lymph nodes:   Cervical, supraclavicular, and axillary nodes normal  Neurologic:   CNII-XII intact, normal strength, sensation and reflexes throughout     Lab Results  Component Value Date   WBC 11.2 (H) 08/30/2018   HGB 8.7 (L) 08/30/2018   HCT 26.3 (L) 08/30/2018   MCV 89.5 08/30/2018   PLT 225 08/30/2018    Lab Results  Component Value Date   CREATININE 0.58 06/07/2018   BUN 7 06/07/2018   NA 138 06/07/2018   K 3.4 (L) 06/07/2018   CL 107 06/07/2018   CO2 23 06/07/2018    Lab Results  Component Value Date   ALT 13 07/14/2015   AST 14 07/14/2015   ALKPHOS 91 07/14/2015   BILITOT 0.3 07/14/2015    No results found for: CHOL, HDL, LDLCALC, LDLDIRECT, TRIG, CHOLHDL   Assessment:   Well woman exam with routine gynecological exam Family history of breast cancer - Plan: MM 3D SCREEN BREAST BILATERAL Encounter for surveillance of contraceptive pills Gastroesophageal reflux disease without esophagitis Overweight (BMI 25.0-29.9)  Plan:    Labs: patient  desires to return for fasting labs  Breast self exam technique reviewed and patient encouraged to perform self-exam monthly. Contraception: OCP (estrogen/progesterone). Refill due in November Discussed healthy lifestyle modifications.   To get flu vaccine next week at job Pap smear up to date, next due in 2021. Refill given for Protonix for acid reflux Will get baseline mammogram for family history of breast cancer.  Recommend routine screening starting at age 7.  Also offered hereditary cancer screening, patient declines at this time.  Follow up in 1 year, or sooner as needed.     Hildred Laser, MD [facility]

## 2019-10-01 ENCOUNTER — Other Ambulatory Visit: Payer: Self-pay | Admitting: Obstetrics and Gynecology

## 2020-01-27 IMAGING — US US RENAL
1 series · 14 of 25 positions shown · non-contrast
Comparison: CT abdomen and pelvis May 22, 2015

CLINICAL DATA: Right flank region pain. Third trimester
intrauterine gestation.

EXAM:
RENAL / URINARY TRACT ULTRASOUND COMPLETE

[Series 1: us renal · 0.22mm/px · 14 of 39 slices shown]
[im 1/39]
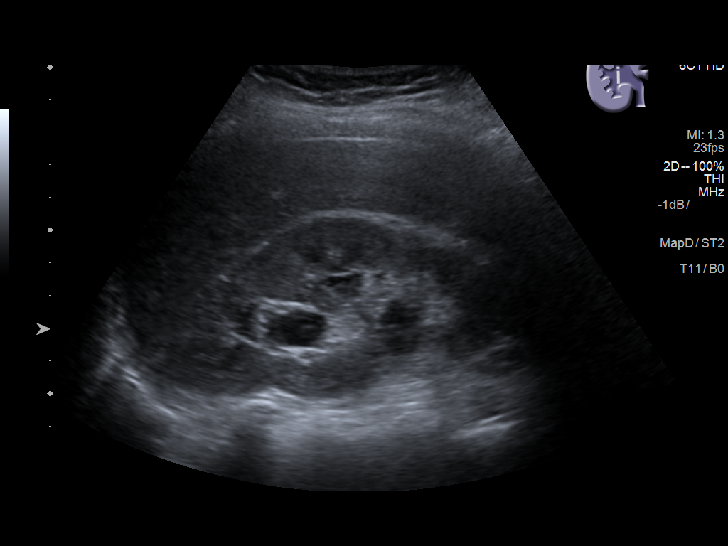
[im 4/39]
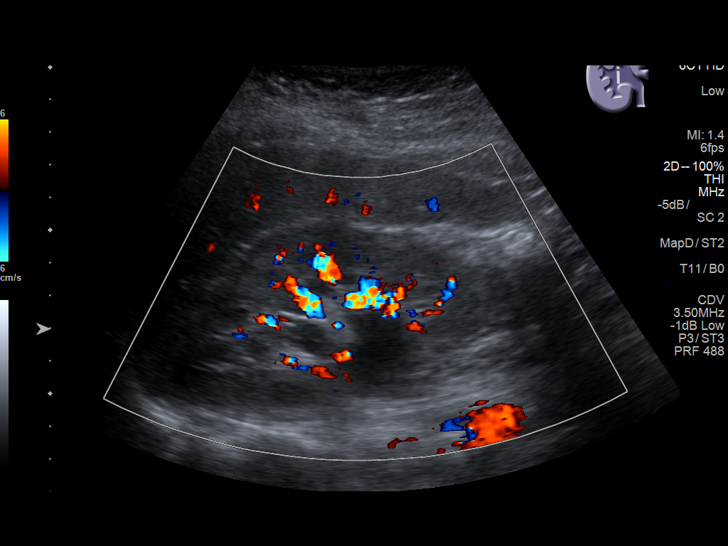
[im 7/39]
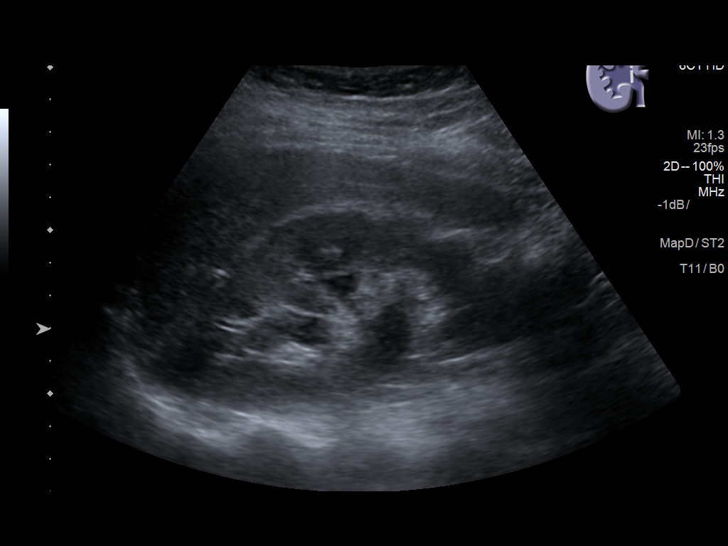
[im 10/39]
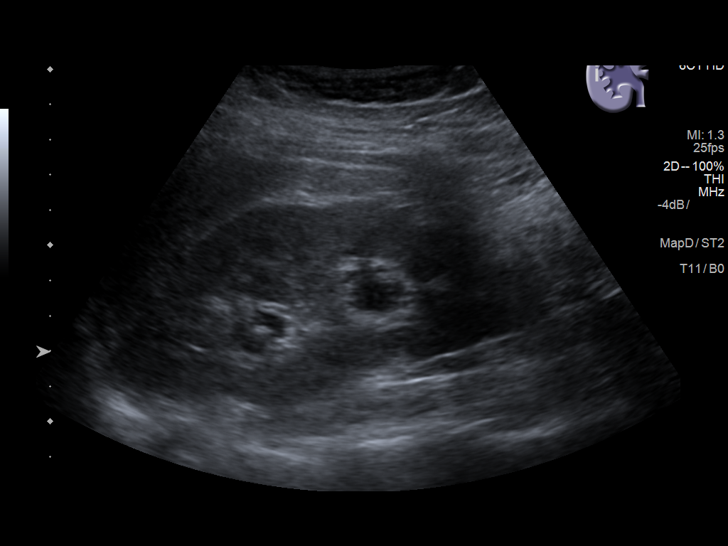
[im 13/39]
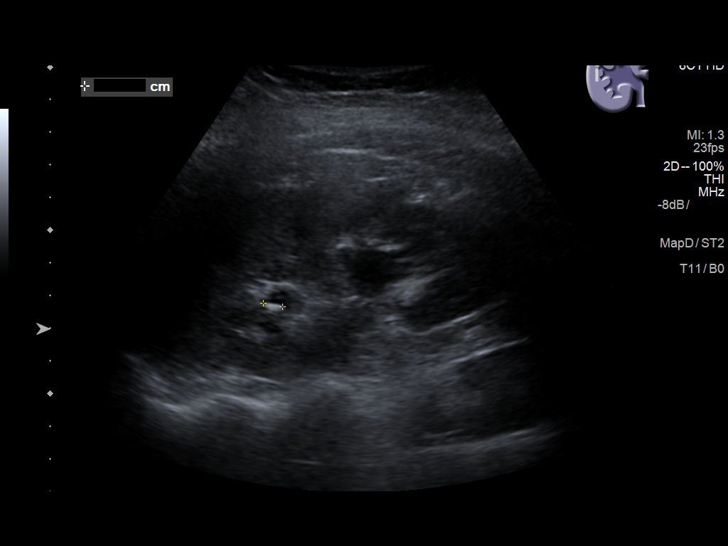
[im 15/39]
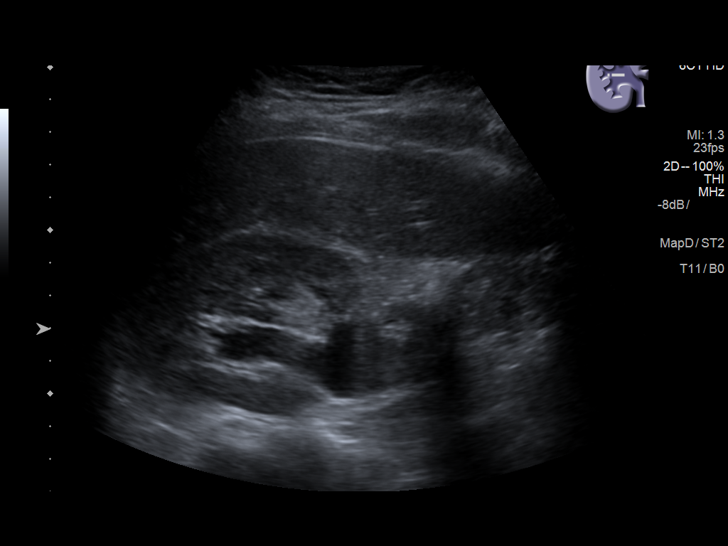
[im 18/39]
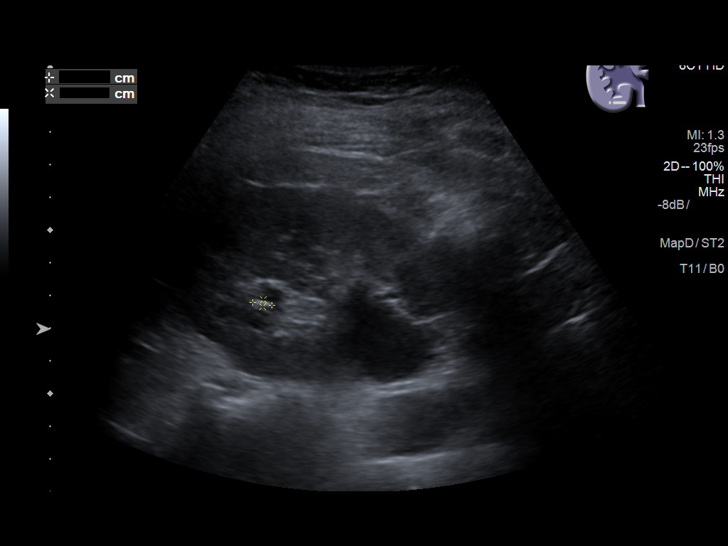
[im 21/39]
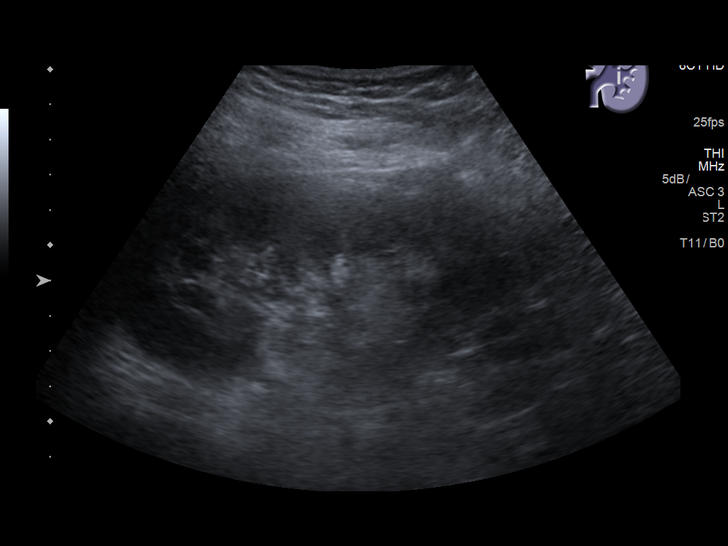
[im 24/39]
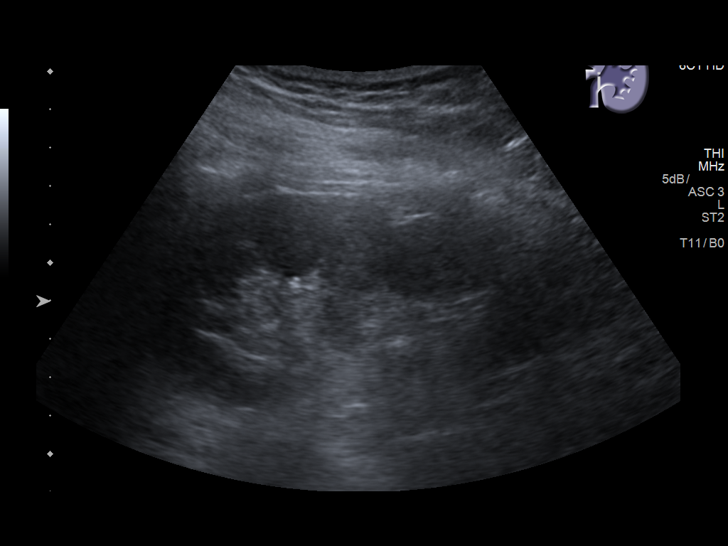
[im 26/39]
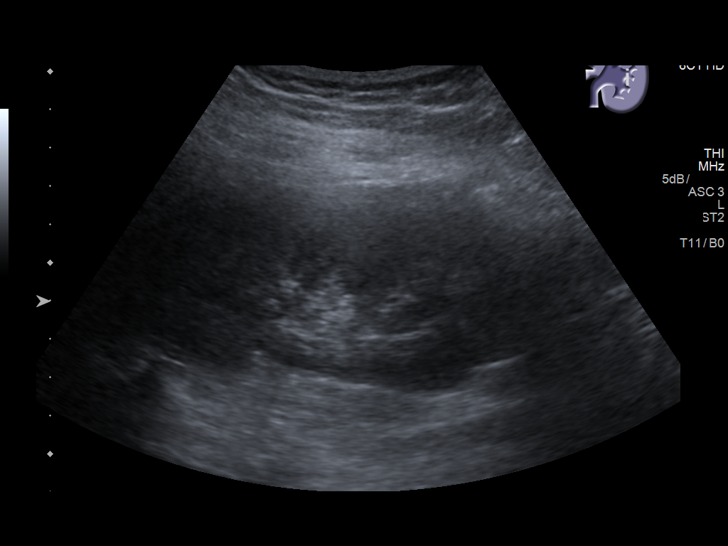
[im 29/39]
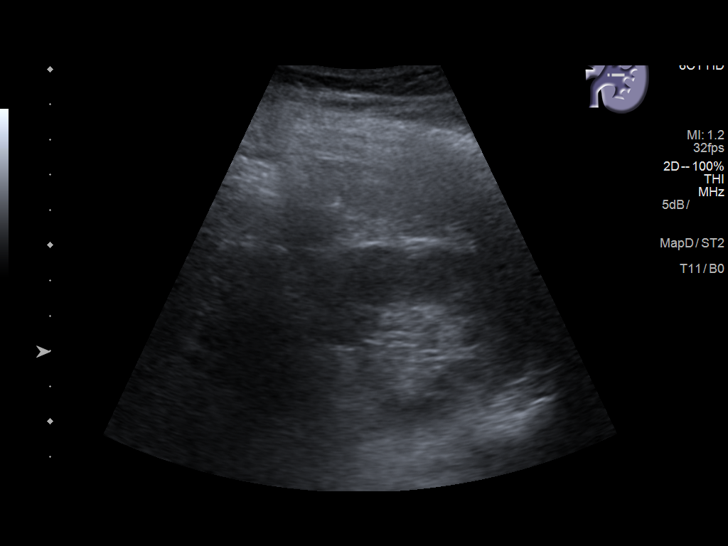
[im 32/39]
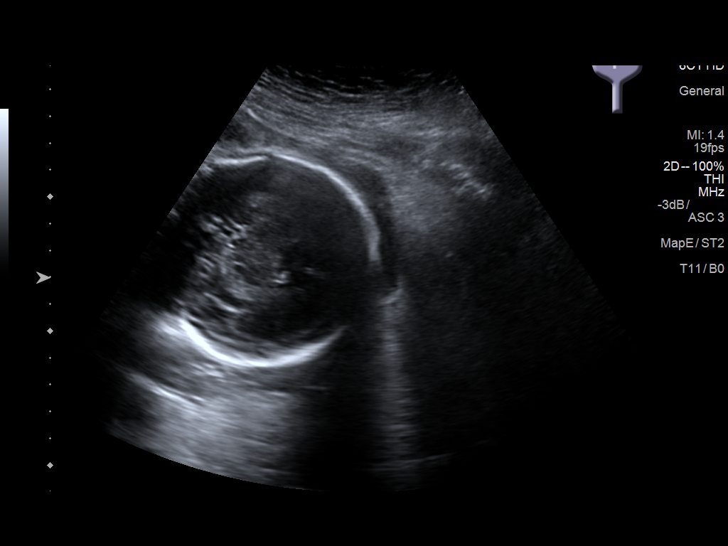
[im 35/39]
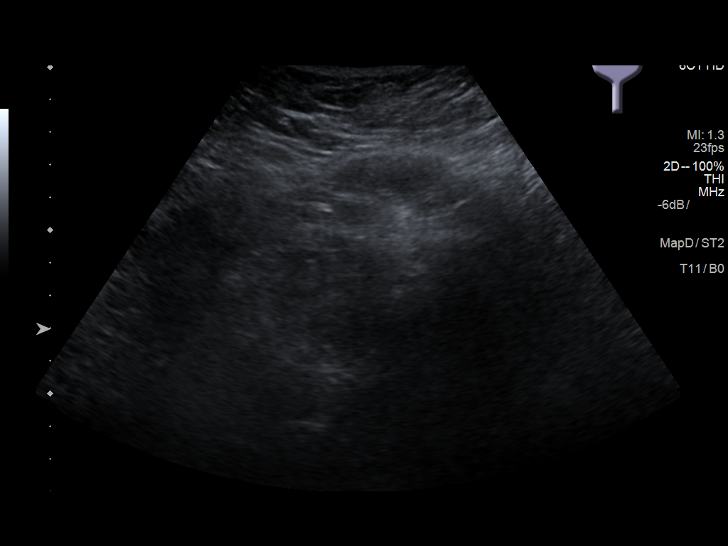
[im 39/39]
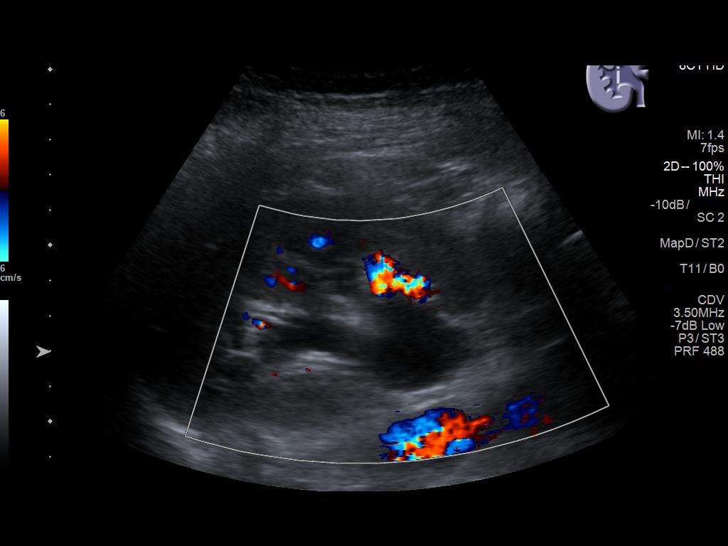

[14 of 25 positions shown; findings below may reference images not displayed]

FINDINGS: Right Kidney:

Length: 11.1 cm. Echogenicity and renal cortical thickness are
within normal limits. No mass or perinephric fluid visualized. There
is moderate pelvicaliectasis on the right. There is a nonobstructing
6 mm calculus in the upper pole the right kidney. No ureterectasis
or ureteral calculus is seen by ultrasound.

Left Kidney:

Length: 11.5 cm. Echogenicity and renal cortical thickness are
within normal limits. No mass, perinephric fluid, or hydronephrosis
visualized. No sonographically demonstrable calculus or
ureterectasis.

Bladder:

Appears normal for degree of bladder distention. Note that bladder
is nearly empty at the time of this examination.
IMPRESSION: 1. Moderate hydronephrosis on the right without obstructing focus
evident. Note the urinary bladder is nearly empty at the time of
this study. There is a third trimester intrauterine gestation.

2.  6 mm nonobstructing calculus upper pole right kidney.

3.  Study otherwise unremarkable.

## 2020-01-27 IMAGING — US US OB LIMITED
1 series · 14 of 26 positions shown · non-contrast
Comparison: none

CLINICAL DATA: Right-sided abdominal pain

EXAM:
LIMITED OBSTETRIC ULTRASOUND

[Series 1: us ob limited · 0.30mm/px · 14 of 26 slices shown]
[im 1/26]
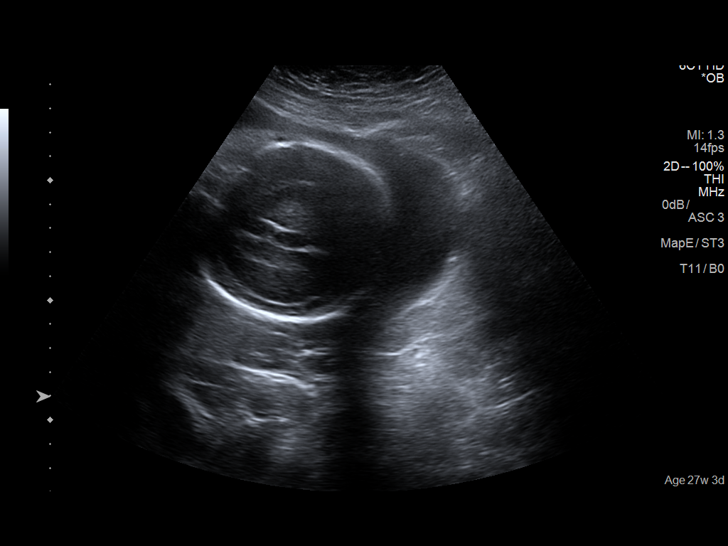
[im 3/26]
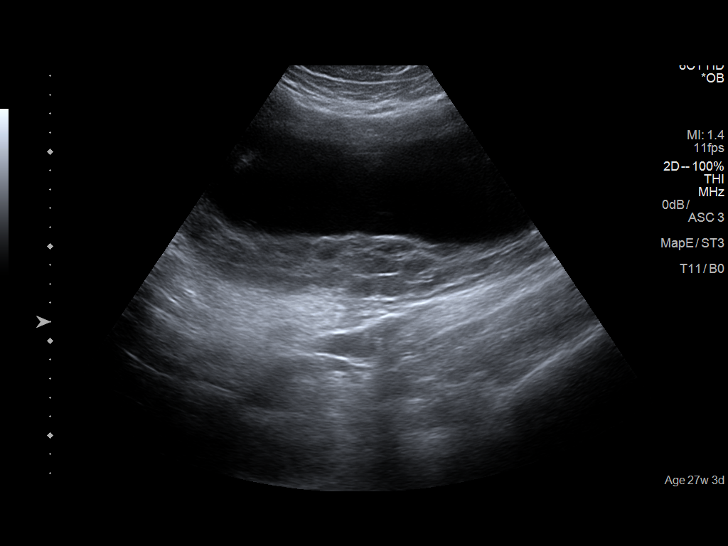
[im 5/26]
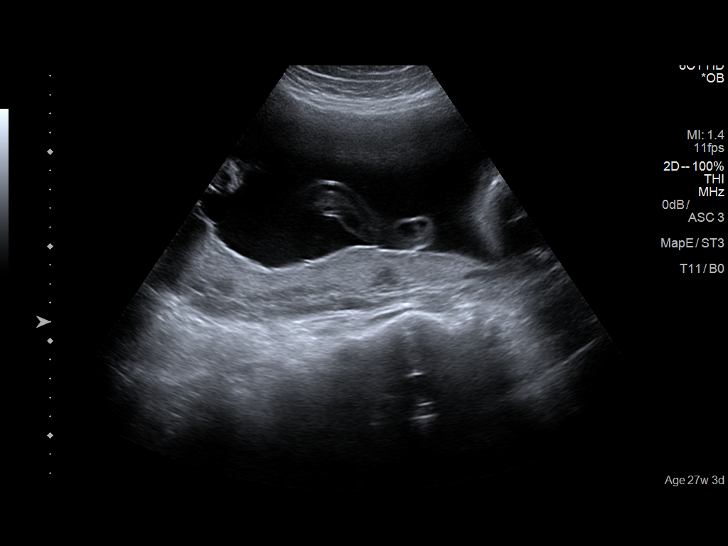
[im 7/26]
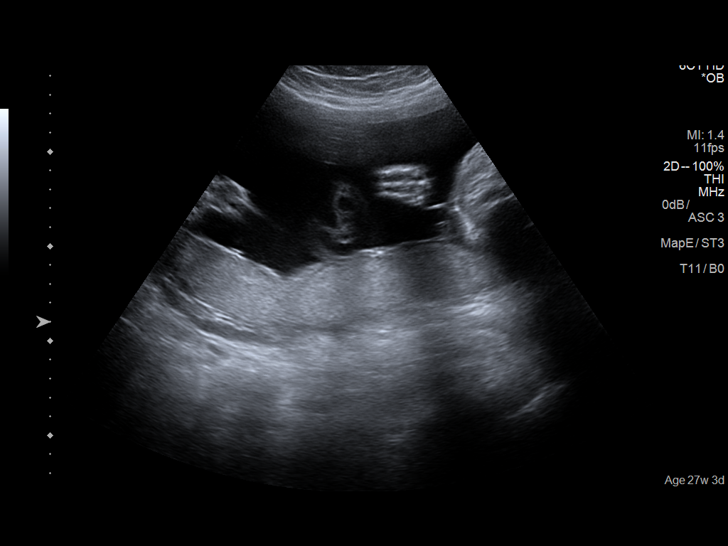
[im 9/26]
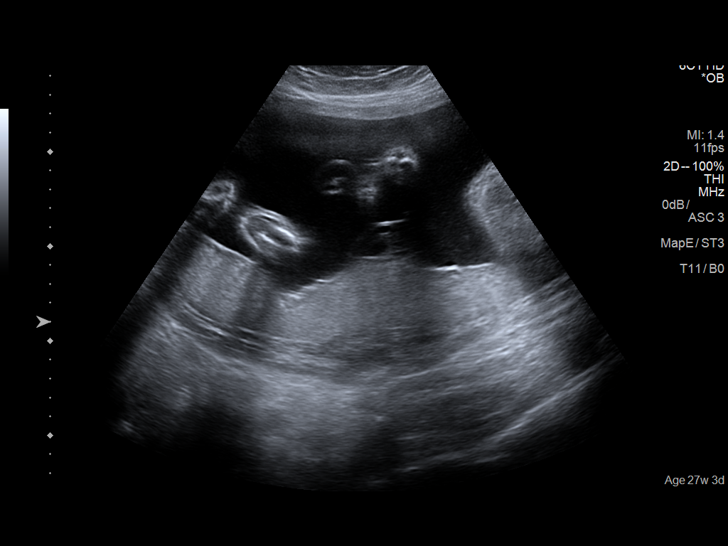
[im 11/26]
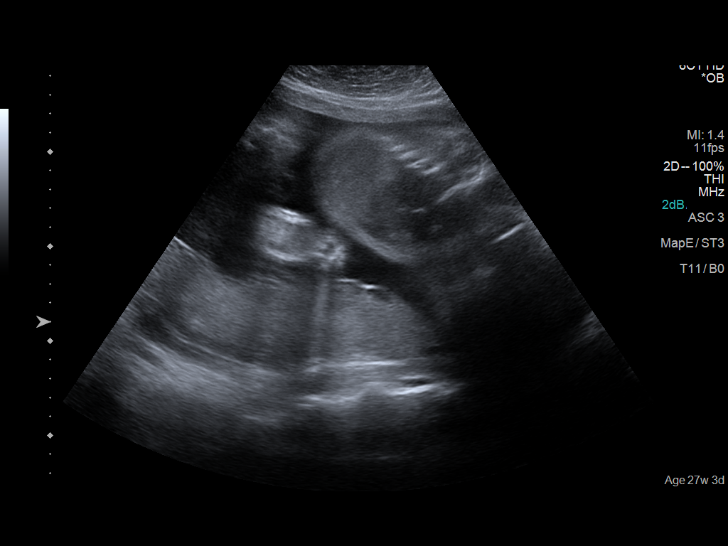
[im 13/26]
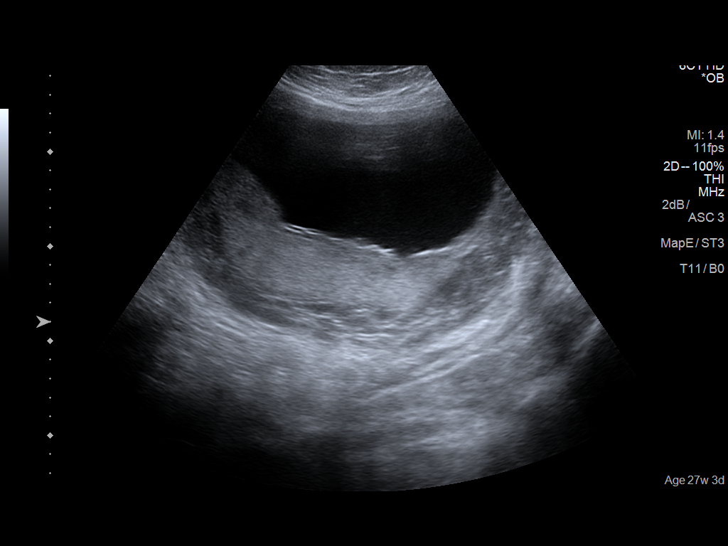
[im 14/26]
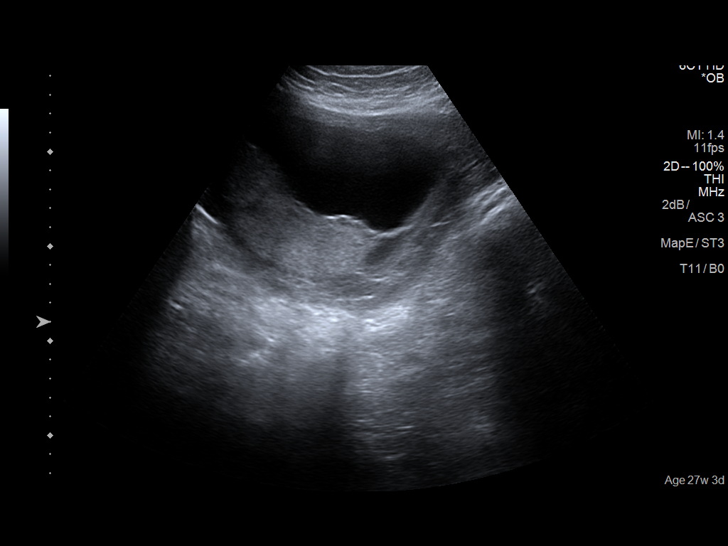
[im 16/26]
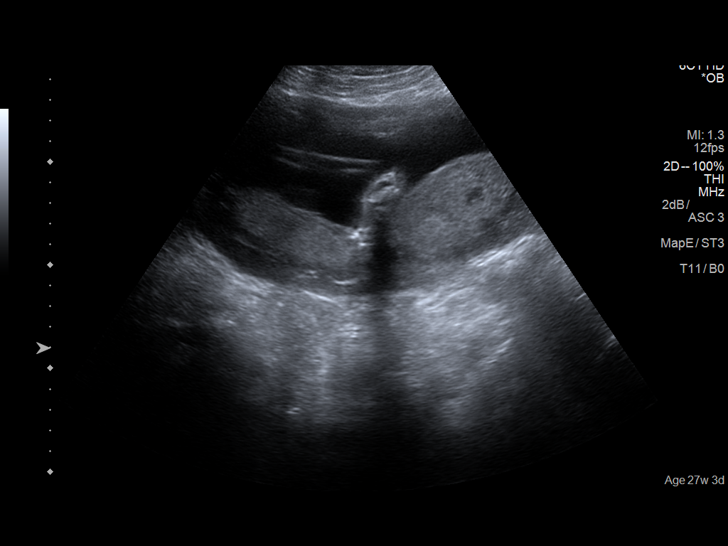
[im 18/26]
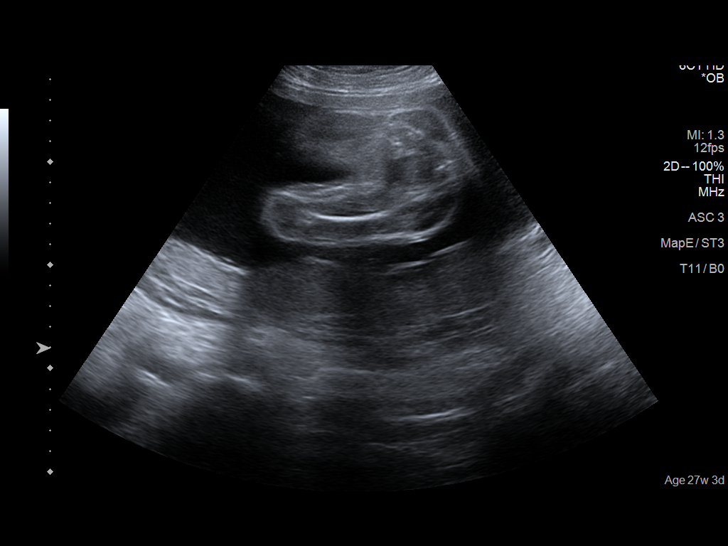
[im 20/26]
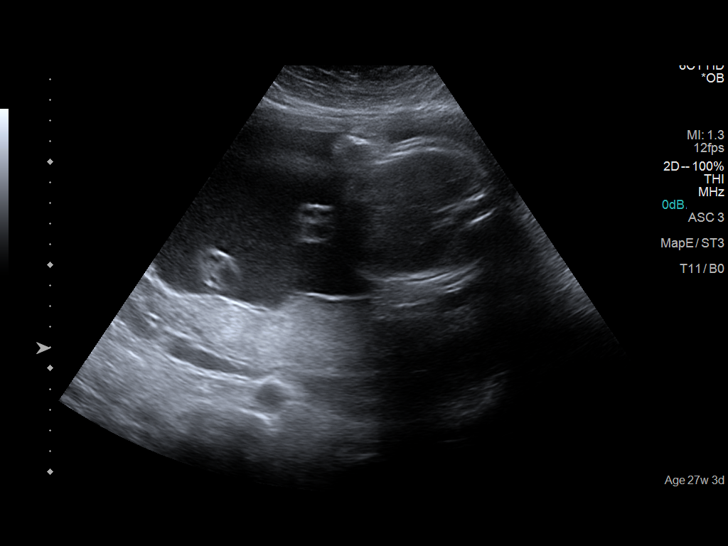
[im 22/26]
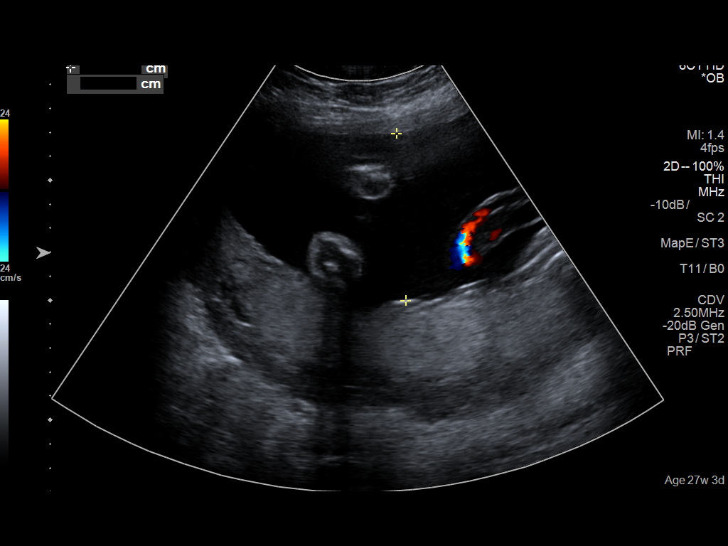
[im 24/26]
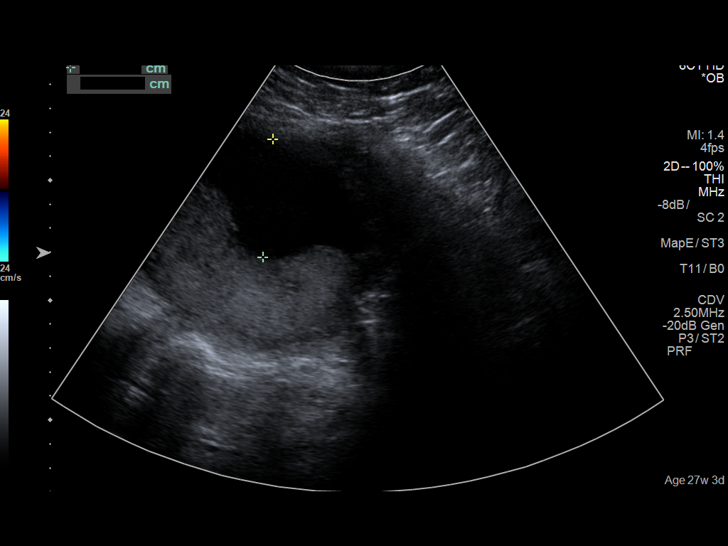
[im 26/26]
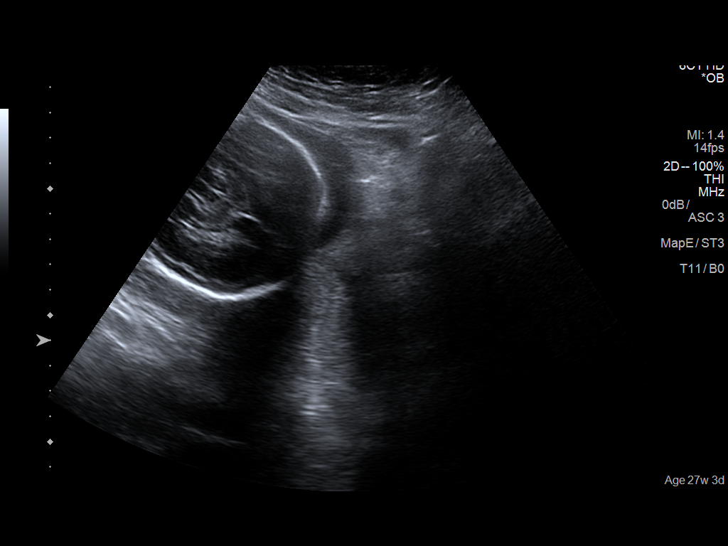

[14 of 26 positions shown; findings below may reference images not displayed]

FINDINGS: Number of Fetuses: 1

Heart Rate:  140 bpm

Movement: Visualized

Presentation: Cephalic

Placental Location: Posterior without evident abruption.

Previa: None

Amniotic Fluid (Subjective): Within normal limits for gestational
age.

BPD: 7.4 cm 29 w  3 d

MATERNAL FINDINGS:

Cervix:  Appears closed.

Uterus/Adnexae: No abnormality visualized. No free maternal pelvic
fluid.
IMPRESSION: Single live intrauterine gestational age with estimated gestational
age of 29+ weeks based on biparietal diameter. No placental lesion
evident. Amniotic fluid volume within normal limits for gestational
age.

.

This exam is performed on an emergent basis to address a specific
clinical concern and does not comprehensively evaluate fetal size,
dating, or anatomy; follow-up complete OB US should be considered if
further fetal assessment is warranted.

## 2020-07-30 ENCOUNTER — Encounter: Payer: 59 | Admitting: Obstetrics and Gynecology

## 2020-08-31 ENCOUNTER — Other Ambulatory Visit: Payer: Self-pay | Admitting: Obstetrics and Gynecology

## 2020-08-31 NOTE — Telephone Encounter (Signed)
Patient scheduled for annual exam in January, will refill for the year at that time.

## 2020-09-11 ENCOUNTER — Encounter: Payer: 59 | Admitting: Obstetrics and Gynecology

## 2020-11-24 ENCOUNTER — Other Ambulatory Visit: Payer: Self-pay | Admitting: Obstetrics and Gynecology

## 2020-12-03 ENCOUNTER — Encounter: Payer: 59 | Admitting: Obstetrics and Gynecology

## 2020-12-22 ENCOUNTER — Other Ambulatory Visit: Payer: Self-pay | Admitting: Obstetrics and Gynecology

## 2020-12-22 NOTE — Telephone Encounter (Signed)
Patient has cancelled several appointments.  She needs at least a contraception televisit until her annual exam in May.

## 2020-12-23 ENCOUNTER — Other Ambulatory Visit: Payer: Self-pay

## 2020-12-23 ENCOUNTER — Telehealth: Payer: Self-pay | Admitting: Obstetrics and Gynecology

## 2020-12-23 ENCOUNTER — Other Ambulatory Visit: Payer: Self-pay | Admitting: Obstetrics and Gynecology

## 2020-12-23 MED ORDER — NORETHIN-ETH ESTRAD-FE BIPHAS 1 MG-10 MCG / 10 MCG PO TABS
1.0000 | ORAL_TABLET | Freq: Every day | ORAL | 3 refills | Status: DC
Start: 2020-12-23 — End: 2020-12-23

## 2020-12-23 MED ORDER — NORETHIN-ETH ESTRAD-FE BIPHAS 1 MG-10 MCG / 10 MCG PO TABS
1.0000 | ORAL_TABLET | Freq: Every day | ORAL | 3 refills | Status: DC
Start: 2020-12-23 — End: 2020-12-24

## 2020-12-23 NOTE — Addendum Note (Signed)
Addended by: Silvano Bilis on: 12/23/2020 09:30 AM   Modules accepted: Orders

## 2020-12-23 NOTE — Telephone Encounter (Signed)
Sent pt a [employer]. Pt is aware that her medication has been refilled. Please read mychart message for 12/23/2020.

## 2020-12-23 NOTE — Telephone Encounter (Signed)
Patient called this morning stating that she had to cancel an apt in January due to her children having covid, patient's apt was rescheduled to May. Pt states that she had put in a request for her birth control but was denied. Pt states she is about to run out and is wondering how she can get a refill until her next apt. Please advice.

## 2020-12-23 NOTE — Telephone Encounter (Signed)
Morrie Sheldon from the Pharmacy called in and stated that the refill for this pt was incorrect the pt is needing LARIN FE 1/20 1-20 MG-MCG tablet  Sent to [facility] pharmacy. Please advise

## 2020-12-24 ENCOUNTER — Other Ambulatory Visit: Payer: Self-pay | Admitting: Obstetrics and Gynecology

## 2020-12-24 ENCOUNTER — Other Ambulatory Visit: Payer: Self-pay

## 2020-12-24 MED ORDER — LARIN 24 FE 1-20 MG-MCG(24) PO TABS: 1.0000 | ORAL_TABLET | Freq: Every day | ORAL | 3 refills | Status: DC

## 2020-12-25 ENCOUNTER — Telehealth: Payer: Self-pay

## 2020-12-25 ENCOUNTER — Other Ambulatory Visit: Payer: Self-pay

## 2020-12-25 ENCOUNTER — Other Ambulatory Visit: Payer: Self-pay | Admitting: Obstetrics and Gynecology

## 2020-12-25 NOTE — Telephone Encounter (Signed)
Spoke to pt and informed her that I had to do a voice approval of rx on the phone due to the mix up with communication. Pt is aware that her medication will be filled and ready for pick up.

## 2021-03-16 ENCOUNTER — Other Ambulatory Visit: Payer: Self-pay

## 2021-03-16 ENCOUNTER — Encounter: Payer: Self-pay | Admitting: Obstetrics and Gynecology

## 2021-03-16 ENCOUNTER — Other Ambulatory Visit (HOSPITAL_COMMUNITY)
Admission: RE | Admit: 2021-03-16 | Discharge: 2021-03-16 | Disposition: A | Discharge status: Home / Self Care | Payer: 59 | Source: Ambulatory Visit | Attending: Obstetrics and Gynecology | Admitting: Obstetrics and Gynecology

## 2021-03-16 ENCOUNTER — Ambulatory Visit (INDEPENDENT_AMBULATORY_CARE_PROVIDER_SITE_OTHER): Payer: 59 | Admitting: Obstetrics and Gynecology

## 2021-03-16 VITALS — BP 107/73 | HR 72 | Ht 64.0 in | Wt 179.0 lb

## 2021-03-16 DIAGNOSIS — E669 Obesity, unspecified: Secondary | ICD-10-CM | POA: Diagnosis not present

## 2021-03-16 DIAGNOSIS — Z124 Encounter for screening for malignant neoplasm of cervix: Secondary | ICD-10-CM

## 2021-03-16 DIAGNOSIS — Z803 Family history of malignant neoplasm of breast: Secondary | ICD-10-CM

## 2021-03-16 DIAGNOSIS — K219 Gastro-esophageal reflux disease without esophagitis: Secondary | ICD-10-CM | POA: Diagnosis not present

## 2021-03-16 DIAGNOSIS — Z8616 Personal history of COVID-19: Secondary | ICD-10-CM | POA: Diagnosis not present

## 2021-03-16 DIAGNOSIS — Z3041 Encounter for surveillance of contraceptive pills: Secondary | ICD-10-CM | POA: Diagnosis not present

## 2021-03-16 DIAGNOSIS — Z01419 Encounter for gynecological examination (general) (routine) without abnormal findings: Secondary | ICD-10-CM

## 2021-03-16 MED ORDER — PANTOPRAZOLE SODIUM 20 MG PO TBEC
DELAYED_RELEASE_TABLET | Freq: Two times a day (BID) | ORAL | 3 refills | Status: DC
  Filled 2021-03-16: qty 60, 30d supply, fill #0
  Filled 2021-07-30: qty 60, 30d supply, fill #1
  Filled 2021-12-03: qty 60, 30d supply, fill #2

## 2021-03-16 MED ORDER — NORETHIN ACE-ETH ESTRAD-FE 1-20 MG-MCG PO TABS
1.0000 | ORAL_TABLET | Freq: Every day | ORAL | 11 refills | Status: DC
  Filled 2021-03-16: qty 28, 28d supply, fill #0

## 2021-03-16 MED ORDER — NORETHIN-ETH ESTRAD-FE BIPHAS 1 MG-10 MCG / 10 MCG PO TABS
1.0000 | ORAL_TABLET | Freq: Every day | ORAL | 3 refills | Status: DC
  Filled 2021-03-16 – 2021-04-27 (×7): qty 84, 84d supply, fill #0
  Filled 2021-07-30: qty 84, 84d supply, fill #1
  Filled 2021-10-29: qty 84, 84d supply, fill #2
  Filled 2022-01-19: qty 84, 84d supply, fill #3

## 2021-03-16 MED ORDER — PHENTERMINE HCL 30 MG PO CAPS
30.0000 mg | ORAL_CAPSULE | ORAL | 0 refills | Status: DC
  Filled 2021-03-16 – 2021-03-22 (×2): qty 30, 30d supply, fill #0

## 2021-03-16 MED ORDER — PHENTERMINE HCL 15 MG PO CAPS
15.0000 mg | ORAL_CAPSULE | ORAL | 0 refills | Status: DC
  Filled 2021-03-16: qty 7, 7d supply, fill #0

## 2021-03-16 NOTE — Progress Notes (Signed)
GYNECOLOGY ANNUAL PHYSICAL EXAM PROGRESS NOTE Subjective:    Samantha Blackwell is a 35 y.o. G50P1001 female who presents for an annual exam.  The patient is sexually active, married. Does the patient wear seatbelts: yes. The patient participates in regular exercise: yes (5-6 days per week). Has the patient ever been transfused or tattooed?: no. Jannifer  reports that there is not domestic violence in her life.   The patient has the following complaints today:  1. Reports whole family had COVID in January. Everyone has recovered and is well.  2. Desires to discuss weight loss.  Tried Octavia diet last year but gained weight back (only lost 7 lbs). Notes that she eats fairly healthy, drinks protein shakes, does not eat out much. Does note some increased stress at work which has led to stress eating (snacking) at work. Drinks plenty of water. Normal portion sizes. Got a [employer] bike in January, exercises 5-6 days per week. Has only lost maybe 8-10 lbs in the past 4 months. Notes that she tried Phentermine once ~ 5 years ago after the birth of her first child, worked well for her    Menstrual History: Menarche age: 68 Patient's last menstrual period was 02/17/2021.  Period Duration (Days): 5 Period Pattern: Regular Menstrual Flow: Moderate Menstrual Control: Tampon Menstrual Control Change Freq (Hours): 2-6 Dysmenorrhea: (!) Moderate Dysmenorrhea Symptoms: Cramping,Diarrhea    Gynecologic History:  Contraception: combined OCPs Last pap smear: 10/03/2017. Abnormal pap smears Denies history of STIs.    OB History  Gravida Para Term Preterm AB Living  2 2 2     2   SAB IAB Ectopic Multiple Live Births        0 2    # Outcome Date GA Lbr Len/2nd Weight Sex Delivery Anes PTL Lv  2 Term 08/29/18 [redacted]w[redacted]d  8 lb 3.9 oz (3.74 kg) F Vag-Vacuum EPI N LIV  1 Term 04/26/15 [redacted]w[redacted]d 03:01 / 00:51 7 lb 5.8 oz (3.34 kg) M Vag-Spont None  LIV     Past Medical History:  Diagnosis Date  . Dysmenorrhea    . GERD (gastroesophageal reflux disease)   . History of kidney stones 05/2015  . History of ovarian cyst     Family History  Problem Relation Age of Onset  . Prostate cancer Father   . Cancer Maternal Grandmother        Breast cancer(reoccurence in contralateral breast in aug 2018) , ovarian cancer (sept 2018)  . Breast cancer Maternal Grandmother   . Breast cancer Maternal Aunt   . Bladder Cancer Neg Hx   . Kidney cancer Neg Hx     Past Surgical History:  Procedure Laterality Date  . NO PAST SURGERIES      Social History   Socioeconomic History  . Marital status: Married    Spouse name: Not on file  . Number of children: Not on file  . Years of education: Not on file  . Highest education level: Not on file  Occupational History  . Not on file  Tobacco Use  . Smoking status: Never Smoker  . Smokeless tobacco: Never Used  Vaping Use  . Vaping Use: Never used  Substance and Sexual Activity  . Alcohol use: No    Alcohol/week: 0.0 standard drinks  . Drug use: No  . Sexual activity: Yes    Birth control/protection: Pill  Other Topics Concern  . Not on file  Social History Narrative  . Not on file   Social  Determinants of Health   Financial Resource Strain: Not on file  Food Insecurity: Not on file  Transportation Needs: Not on file  Physical Activity: Not on file  Stress: Not on file  Social Connections: Not on file  Intimate Partner Violence: Not on file    Current Outpatient Medications on File Prior to Visit  Medication Sig Dispense Refill  . cetirizine (ZYRTEC) 10 MG tablet Take 10 mg by mouth daily.    . fluticasone (FLONASE) 50 MCG/ACT nasal spray Place 2 sprays into both nostrils daily. 16 g 6  . pantoprazole (PROTONIX) 20 MG tablet TAKE 1 TABLET BY MOUTH TWICE DAILY 60 tablet 3  . norethindrone-ethinyl estradiol (LOESTRIN FE) 1-20 MG-MCG tablet TAKE 1 TABLET BY MOUTH DAILY 28 tablet 3   No current facility-administered medications on file prior to  visit.    Allergies  Allergen Reactions  . Penicillins Rash     Review of Systems  Constitutional: negative for chills, fatigue, fevers and sweats Eyes: negative for irritation, redness and visual disturbance Ears, nose, mouth, throat, and face: negative for hearing loss, nasal congestion, snoring and tinnitus Respiratory: negative for asthma, cough, sputum Cardiovascular: negative for chest pain, dyspnea, exertional chest pressure/discomfort, irregular heart beat, palpitations and syncope Gastrointestinal: negative for abdominal pain, change in bowel habits, nausea and vomiting Genitourinary: negative for abnormal menstrual periods, genital lesions, sexual problems and vaginal discharge, dysuria and urinary incontinence Integument/breast: negative for breast lump, breast tenderness and nipple discharge Hematologic/lymphatic: negative for bleeding and easy bruising Musculoskeletal:negative for back pain and muscle weakness Neurological: negative for dizziness, headaches, vertigo and weakness Endocrine: negative for diabetic symptoms including polydipsia, polyuria and skin dryness Allergic/Immunologic: negative for hay fever and urticaria    Objective:    BP 107/73   Pulse 72   Ht 5\' 4"  (1.626 m)   Wt 179 lb (81.2 kg)   LMP 02/17/2021   BMI 30.73 kg/m .  Body mass index is 30.73 kg/m.  General Appearance:    Alert, cooperative, no distress, appears stated age, overweight  Head:    Normocephalic, without obvious abnormality, atraumatic  Eyes:    PERRL, conjunctiva/corneas clear, EOM's intact, both eyes  Ears:    Normal external ear canals, both ears  Nose:   Nares normal, septum midline, mucosa normal, no drainage or sinus tenderness  Throat:   Lips, mucosa, and tongue normal; teeth and gums normal  Neck:   Supple, symmetrical, trachea midline, no adenopathy; thyroid: no enlargement/tenderness/nodules; no carotid bruit or JVD  Back:     Symmetric, no curvature, ROM normal, no  CVA tenderness  Lungs:     Clear to auscultation bilaterally, respirations unlabored  Chest Wall:    No tenderness or deformity   Heart:    Regular rate and rhythm, S1 and S2 normal, no murmur, rub or gallop  Breast Exam:    No tenderness, masses, or nipple abnormality  Abdomen:     Soft, non-tender, bowel sounds active all four quadrants, no masses, no organomegaly.   Genitalia:    Pelvic:external genitalia normal, vagina with lesions, discharge, or tenderness, rectovaginal septum  normal. Cervix normal in appearance, no cervical motion tenderness; uterus normal size, shape, consistency, mobile; no adnexal masses or tenderness.     Rectal:    Normal external sphincter.  No hemorrhoids appreciated. Internal exam not done.   Extremities:   Extremities normal, atraumatic, no cyanosis or edema  Pulses:   2+ and symmetric all extremities  Skin:   Skin color,  texture, turgor normal, no rashes or lesions  Lymph nodes:   Cervical, supraclavicular, and axillary nodes normal  Neurologic:   CNII-XII intact, normal strength, sensation and reflexes throughout     Lab Results  Component Value Date   WBC 11.2 (H) 08/30/2018   HGB 8.7 (L) 08/30/2018   HCT 26.3 (L) 08/30/2018   MCV 89.5 08/30/2018   PLT 225 08/30/2018    Lab Results  Component Value Date   CREATININE 0.58 06/07/2018   BUN 7 06/07/2018   NA 138 06/07/2018   K 3.4 (L) 06/07/2018   CL 107 06/07/2018   CO2 23 06/07/2018    Lab Results  Component Value Date   ALT 13 07/14/2015   AST 14 07/14/2015   ALKPHOS 91 07/14/2015   BILITOT 0.3 07/14/2015    No results found for: CHOL, HDL, LDLCALC, LDLDIRECT, TRIG, CHOLHDL   Assessment:   1. Encounter for well woman exam with routine gynecological exam   2. Obesity (BMI 30.0-34.9)   3. History of COVID-19   4. Encounter for surveillance of contraceptive pills   5. Gastroesophageal reflux disease without esophagitis   6. Pap smear for cervical cancer screening   7. Family  history of breast cancer     Plan:    - Labs: patient desires to return for fasting labs - Breast self exam technique reviewed and patient encouraged to perform self-exam monthly. - Family history of breast cancer. Will get baseline mammogram for family history of breast cancer at age 71.  Recommend routine screening starting at age 45.  Also previously offered hereditary cancer screening, patient declined. - Contraception: OCP (estrogen/progesterone). Desires to try Lo-Loestrin as she heard it may potentially take her cycles away.  Will prescribe. Currently on Loestrin. - Pap smear performed today. - Refill given for Protonix for acid reflux.  -Patient desires to discuss weight loss medication.  She has tried other dietary supplements and is currently exercising and eating a healthy diet, has increased her protein intake with small results noted.  Discussed weight loss medication options with patient.  Patient has tried a trial of phentermine before, is willing to try it again.  We will start patient on trial. The risks and benefits and side effects of medication were discussed. The pros and cons of suppressing appetite and boosting metabolism is discussed. Risks of tolerence and addiction is discussed for selected agents discussed. Use of medicine will ne short term, such as 3-4 months at a time followed by a period of time off of the medicine to avoid these risks and side effects for Adipex discussed. Pt to call with any negative side effects and agrees to keep follow up appts.   Follow up in 1 year, or sooner as needed for annual exam.  Patient to follow-up in 4 to 6 weeks for weight loss follow-up.Marland Kitchen   Hildred Laser, MD [facility]

## 2021-03-16 NOTE — Patient Instructions (Addendum)
Preventive Care 21-33 Years Old, Female Preventive care refers to lifestyle choices and visits with your health care provider that can promote health and wellness. This includes:  A yearly physical exam. This is also called an annual wellness visit.  Regular dental and eye exams.  Immunizations.  Screening for certain conditions.  Healthy lifestyle choices, such as: ? Eating a healthy diet. ? Getting regular exercise. ? Not using drugs or products that contain nicotine and tobacco. ? Limiting alcohol use. What can I expect for my preventive care visit? Physical exam Your health care provider may check your:  Height and weight. These may be used to calculate your BMI (body mass index). BMI is a measurement that tells if you are at a healthy weight.  Heart rate and blood pressure.  Body temperature.  Skin for abnormal spots. Counseling Your health care provider may ask you questions about your:  Past medical problems.  Family's medical history.  Alcohol, tobacco, and drug use.  Emotional well-being.  Home life and relationship well-being.  Sexual activity.  Diet, exercise, and sleep habits.  Work and work environment.  Access to firearms.  Method of birth control.  Menstrual cycle.  Pregnancy history. What immunizations do I need? Vaccines are usually given at various ages, according to a schedule. Your health care provider will recommend vaccines for you based on your age, medical history, and lifestyle or other factors, such as travel or where you work.   What tests do I need? Blood tests  Lipid and cholesterol levels. These may be checked every 5 years starting at age 20.  Hepatitis C test.  Hepatitis B test. Screening  Diabetes screening. This is done by checking your blood sugar (glucose) after you have not eaten for a while (fasting).  STD (sexually transmitted disease) testing, if you are at risk.  BRCA-related cancer screening. This may be  done if you have a family history of breast, ovarian, tubal, or peritoneal cancers.  Pelvic exam and Pap test. This may be done every 3 years starting at age 21. Starting at age 30, this may be done every 5 years if you have a Pap test in combination with an HPV test. Talk with your health care provider about your test results, treatment options, and if necessary, the need for more tests.   Follow these instructions at home: Eating and drinking  Eat a healthy diet that includes fresh fruits and vegetables, whole grains, lean protein, and low-fat dairy products.  Take vitamin and mineral supplements as recommended by your health care provider.  Do not drink alcohol if: ? Your health care provider tells you not to drink. ? You are pregnant, may be pregnant, or are planning to become pregnant.  If you drink alcohol: ? Limit how much you have to 0-1 drink a day. ? Be aware of how much alcohol is in your drink. In the [place] one drink equals one 12 oz bottle of beer (355 mL), one 5 oz glass of wine (148 mL), or one 1 oz glass of hard liquor (44 mL).   Lifestyle  Take daily care of your teeth and gums. Brush your teeth every morning and night with fluoride toothpaste. Floss one time each day.  Stay active. Exercise for at least 30 minutes 5 or more days each week.  Do not use any products that contain nicotine or tobacco, such as cigarettes, e-cigarettes, and chewing tobacco. If you need help quitting, ask your health care provider.  Do not   use drugs.  If you are sexually active, practice safe sex. Use a condom or other form of protection to prevent STIs (sexually transmitted infections).  If you do not wish to become pregnant, use a form of birth control. If you plan to become pregnant, see your health care provider for a prepregnancy visit.  Find healthy ways to cope with stress, such as: ? Meditation, yoga, or listening to music. ? Journaling. ? Talking to a trusted  person. ? Spending time with friends and family. Safety  Always wear your seat belt while driving or riding in a vehicle.  Do not drive: ? If you have been drinking alcohol. Do not ride with someone who has been drinking. ? When you are tired or distracted. ? While texting.  Wear a helmet and other protective equipment during sports activities.  If you have firearms in your house, make sure you follow all gun safety procedures.  Seek help if you have been physically or sexually abused. What's next?  Go to your health care provider once a year for an annual wellness visit.  Ask your health care provider how often you should have your eyes and teeth checked.  Stay up to date on all vaccines. This information is not intended to replace advice given to you by your health care provider. Make sure you discuss any questions you have with your health care provider. Document Revised: 06/28/2020 Document Reviewed: 07/12/2018 Elsevier Patient Education  2021 [place] Breast self-awareness is knowing how your breasts look and feel. Doing breast self-awareness is important. It allows you to catch a breast problem early while it is still small and can be treated. All women should do breast self-awareness, including women who have had breast implants. Tell your doctor if you notice a change in your breasts. What you need:  A mirror.  A well-lit room. How to do a breast self-exam A breast self-exam is one way to learn what is normal for your breasts and to check for changes. To do a breast self-exam: Look for changes 1. Take off all the clothes above your waist. 2. Stand in front of a mirror in a room with good lighting. 3. Put your hands on your hips. 4. Push your hands down. 5. Look at your breasts and nipples in the mirror to see if one breast or nipple looks different from the other. Check to see if: ? The shape of one breast is different. ? The size of one  breast is different. ? There are wrinkles, dips, and bumps in one breast and not the other. 6. Look at each breast for changes in the skin, such as: ? Redness. ? Scaly areas. 7. Look for changes in your nipples, such as: ? Liquid around the nipples. ? Bleeding. ? Dimpling. ? Redness. ? A change in where the nipples are.   Feel for changes 1. Lie on your back on the floor. 2. Feel each breast. To do this, follow these steps: ? Pick a breast to feel. ? Put the arm closest to that breast above your head. ? Use your other arm to feel the nipple area of your breast. Feel the area with the pads of your three middle fingers by making small circles with your fingers. For the first circle, press lightly. For the second circle, press harder. For the third circle, press even harder. ? Keep making circles with your fingers at the different pressures as you move down your breast. Stop when  Stop when you feel your ribs. ? Move your fingers a little toward the center of your body. ? Start making circles with your fingers again, this time going up until you reach your collarbone. ? Keep making up-and-down circles until you reach your armpit. Remember to keep using the three pressures. ? Feel the other breast in the same way. 3. Sit or stand in the tub or shower. 4. With soapy water on your skin, feel each breast the same way you did in step 2 when you were lying on the floor.   Write down what you find Writing down what you find can help you remember what to tell your doctor. Write down:  What is normal for each breast.  Any changes you find in each breast, including: ? The kind of changes you find. ? Whether you have pain. ? Size and location of any lumps.  When you last had your menstrual period. General tips  Check your breasts every month.  If you are breastfeeding, the best time to check your breasts is after you feed your baby or after you use a breast pump.  If you get menstrual periods,  the best time to check your breasts is 5-7 days after your menstrual period is over.  With time, you will become comfortable with the self-exam, and you will begin to know if there are changes in your breasts. Contact a doctor if you:  See a change in the shape or size of your breasts or nipples.  See a change in the skin of your breast or nipples, such as red or scaly skin.  Have fluid coming from your nipples that is not normal.  Find a lump or thick area that was not there before.  Have pain in your breasts.  Have any concerns about your breast health. Summary  Breast self-awareness includes looking for changes in your breasts, as well as feeling for changes within your breasts.  Breast self-awareness should be done in front of a mirror in a well-lit room.  You should check your breasts every month. If you get menstrual periods, the best time to check your breasts is 5-7 days after your menstrual period is over.  Let your doctor know of any changes you see in your breasts, including changes in size, changes on the skin, pain or tenderness, or fluid from your nipples that is not normal. This information is not intended to replace advice given to you by your health care provider. Make sure you discuss any questions you have with your health care provider. Document Revised: 06/19/2018 Document Reviewed: 06/19/2018 Elsevier Patient Education  2021 Elsevier Inc.    Preventing Unhealthy Weight Gain, Adult Staying at a healthy weight is important to your overall health. When fat builds up in your body, you may become overweight or obese. Being overweight or obese increases your risk of developing certain health problems, such as heart disease, diabetes, sleeping problems, joint problems, and some types of cancer. Unhealthy weight gain is often the result of making unhealthy food choices or not getting enough exercise. You can make changes to your lifestyle to prevent obesity and stay as  healthy as possible. What nutrition changes can be made?  Eat only as much as your body needs. To do this: ? Pay attention to signs that you are hungry or full. Stop eating as soon as you feel full. ? If you feel hungry, try drinking water first before eating. Drink enough water so your urine is   clear or pale yellow. ? Eat smaller portions. Pay attention to portion sizes when eating out. ? Look at serving sizes on food labels. Most foods contain more than one serving per container. ? Eat the recommended number of calories for your gender and activity level. For most active people, a daily total of 2,000 calories is appropriate. If you are trying to lose weight or are not very active, you may need to eat fewer calories. Talk with your health care provider or a diet and nutrition specialist (dietitian) about how many calories you need each day.  Choose healthy foods, such as: ? Fruits and vegetables. At each meal, try to fill at least half of your plate with fruits and vegetables. ? Whole grains, such as whole-wheat bread, brown rice, and quinoa. ? Lean meats, such as chicken or fish. ? Other healthy proteins, such as beans, eggs, or tofu. ? Healthy fats, such as nuts, seeds, fatty fish, and olive oil. ? Low-fat or fat-free dairy products.  Check food labels, and avoid food and drinks that: ? Are high in calories. ? Have added sugar. ? Are high in sodium. ? Have saturated fats or trans fats.  Cook foods in healthier ways, such as by baking, broiling, or grilling.  Make a meal plan for the week, and shop with a grocery list to help you stay on track with your purchases. Try to avoid going to the grocery store when you are hungry.  When grocery shopping, try to shop around the outside of the store first, where the fresh foods are. Doing this helps you to avoid prepackaged foods, which can be high in sugar, salt (sodium), and fat.   What lifestyle changes can be made?  Exercise for 30 or  more minutes on 5 or more days each week. Exercising may include brisk walking, yard work, biking, running, swimming, and team sports like basketball and soccer. Ask your health care provider which exercises are safe for you.  Do muscle-strengthening activities, such as lifting weights or using resistance bands, on 2 or more days a week.  Do not use any products that contain nicotine or tobacco, such as cigarettes and e-cigarettes. If you need help quitting, ask your health care provider.  Limit alcohol intake to no more than 1 drink a day for nonpregnant women and 2 drinks a day for men. One drink equals 12 oz of beer, 5 oz of wine, or 1 oz of hard liquor.  Try to get 7-9 hours of sleep each night.   What other changes can be made?  Keep a food and activity journal to keep track of: ? What you ate and how many calories you had. Remember to count the calories in sauces, dressings, and side dishes. ? Whether you were active, and what exercises you did. ? Your calorie, weight, and activity goals.  Check your weight regularly. Track any changes. If you notice you have gained weight, make changes to your diet or activity routine.  Avoid taking weight-loss medicines or supplements. Talk to your health care provider before starting any new medicine or supplement.  Talk to your health care provider before trying any new diet or exercise plan. Why are these changes important? Eating healthy, staying active, and having healthy habits can help you to prevent obesity. Those changes also:  Help you manage stress and emotions.  Help you connect with friends and family.  Improve your self-esteem.  Improve your sleep.  Prevent long-term health problems. What can happen   if changes are not made? Being obese or overweight can cause you to develop joint or bone problems, which can make it hard for you to stay active or do activities you enjoy. Being obese or overweight also puts stress on your heart  and lungs and can lead to health problems like diabetes, heart disease, and some cancers. Where to find more information Talk with your health care provider or a dietitian about healthy eating and healthy lifestyle choices. You may also find information from:  [place] Department of Agriculture, MyPlate: www.choosemyplate.gov  American Heart Association: www.heart.org  Centers for Disease Control and Prevention: www.cdc.gov Summary  Staying at a healthy weight is important to your overall health. It helps you to prevent certain diseases and health problems, such as heart disease, diabetes, joint problems, sleep disorders, and some types of cancer.  Being obese or overweight can cause you to develop joint or bone problems, which can make it hard for you to stay active or do activities you enjoy.  You can prevent unhealthy weight gain by eating a healthy diet, exercising regularly, not smoking, limiting alcohol, and getting enough sleep.  Talk with your health care provider or a dietitian for guidance about healthy eating and healthy lifestyle choices. This information is not intended to replace advice given to you by your health care provider. Make sure you discuss any questions you have with your health care provider. Document Revised: 02/27/2020 Document Reviewed: 02/27/2020 Elsevier Patient Education  2021 Elsevier Inc.   

## 2021-03-16 NOTE — Progress Notes (Signed)
Pt present for annual exam. Pt stated that she was doing well.  

## 2021-03-18 ENCOUNTER — Other Ambulatory Visit: Payer: Self-pay

## 2021-03-19 ENCOUNTER — Other Ambulatory Visit: Payer: Self-pay

## 2021-03-22 ENCOUNTER — Other Ambulatory Visit: Payer: Self-pay

## 2021-03-24 LAB — CYTOLOGY - PAP
Comment: NEGATIVE
Diagnosis: NEGATIVE
High risk HPV: NEGATIVE

## 2021-03-26 ENCOUNTER — Other Ambulatory Visit: Payer: Self-pay

## 2021-03-26 MED ORDER — CARESTART COVID-19 HOME TEST VI KIT
PACK | 0 refills | Status: DC
  Filled 2021-03-26: qty 2, 4d supply, fill #0

## 2021-04-14 ENCOUNTER — Telehealth: Payer: Self-pay | Admitting: Obstetrics and Gynecology

## 2021-04-14 ENCOUNTER — Other Ambulatory Visit: Payer: Self-pay

## 2021-04-14 NOTE — Telephone Encounter (Signed)
Anniebelle called in and states she will run out of Phentermine 30mg  on June 8th.  Dr. [id number] requested she follow up on or around 6/14th.  Shavonna states she will be out of her medication a whole week and is wondering if she would be able to get a refill before her appointment that is scheduled on 6/15th.  Ricardo is requesting a phone call to let her know either way.  Confirmed pharmacy [facility].  Please advise.

## 2021-04-15 ENCOUNTER — Other Ambulatory Visit: Payer: Self-pay

## 2021-04-15 MED ORDER — NORETHIN ACE-ETH ESTRAD-FE 1-20 MG-MCG PO TABS
1.0000 | ORAL_TABLET | Freq: Every day | ORAL | 11 refills | Status: DC
  Filled 2021-04-15: qty 28, 28d supply, fill #0

## 2021-04-15 MED ORDER — TARINA 24 FE 1-20 MG-MCG(24) PO TABS
1.0000 | ORAL_TABLET | Freq: Every day | ORAL | 3 refills | Status: DC
  Filled 2021-04-15: qty 28, 28d supply, fill #0

## 2021-04-16 ENCOUNTER — Other Ambulatory Visit: Payer: Self-pay

## 2021-04-19 ENCOUNTER — Other Ambulatory Visit: Payer: Self-pay

## 2021-04-20 ENCOUNTER — Other Ambulatory Visit: Payer: Self-pay

## 2021-04-20 ENCOUNTER — Ambulatory Visit (INDEPENDENT_AMBULATORY_CARE_PROVIDER_SITE_OTHER): Payer: 59 | Admitting: Obstetrics and Gynecology

## 2021-04-20 ENCOUNTER — Encounter: Payer: Self-pay | Admitting: Obstetrics and Gynecology

## 2021-04-20 VITALS — BP 116/78 | HR 80 | Resp 16 | Ht 64.0 in | Wt 173.7 lb

## 2021-04-20 DIAGNOSIS — Z7689 Persons encountering health services in other specified circumstances: Secondary | ICD-10-CM | POA: Diagnosis not present

## 2021-04-20 DIAGNOSIS — E663 Overweight: Secondary | ICD-10-CM

## 2021-04-20 MED ORDER — PHENTERMINE HCL 30 MG PO CAPS
30.0000 mg | ORAL_CAPSULE | ORAL | 1 refills | Status: DC
  Filled 2021-04-20: qty 30, 30d supply, fill #0
  Filled 2021-05-20: qty 30, 30d supply, fill #1

## 2021-04-20 NOTE — Progress Notes (Signed)
    GYNECOLOGY PROGRESS NOTE  Subjective:    Patient ID: Samantha Blackwell, female    DOB: 1986/02/07, 35 y.o.   MRN: [id number]  HPI  Patient is a 35 y.o. female who presents for 6 week weight management follow up. She initiated use of Phentermine 6 weeks ago for obesity (BMI 30).  Reported initially having difficulty sleeping, however has now returned to normal sleep pattern. Denies any other undesirable side effects and reports compliance with medications. Weight goal is loss of 20 lbs.    Current interventions:  1. Diet - limiting carbs, increasing protein. Drinking ~ 2 L of water per day.  2. Activity - riding [place] bike 5 days per week.  3. Reports bowel movements are normal.    The following portions of the patient's history were reviewed and updated as appropriate: allergies, current medications, past family history, past medical history, past social history, past surgical history and problem list.  Review of Systems Pertinent items noted in HPI and remainder of comprehensive ROS otherwise negative.   Objective:    Vitals with BMI 04/20/2021 03/16/2021 07/30/2019  Height 5\' 4"  5\' 4"  5\' 4"   Weight 173 lbs 11 oz 179 lbs 172 lbs 14 oz  BMI 29.8 30.71 29.66  Systolic 116 107 98  Diastolic 78 73 65  Pulse 80 72 76    General appearance: alert and no distress Abdomen: soft, non-tender.  Waist circumference 36 in.    Labs:  No new labs  Assessment:   Weight management Overweight, Body mass index is 29.82 kg/m.  Plan:   1. Patient has lost 6 lbs on first month of Phentermine.  Can continue use. Discusseduse of medicine will be short term, such as 3-4 months at a time followed by a period of time off of the medicine to avoid risks and side effects for Adipex. Pt to call with any negative side effects and agrees to keep follow up appts. 2. Follow up in 2 months. Patient will check weight at 1 month interval. If noticing a decline in weight loss despite continued efforts, can  increase to 37.5 mg dosing. Discussed possibility of side effects (palpitations, sleep disturbances) on increased dosing).    , MD [facility]

## 2021-04-20 NOTE — Patient Instructions (Signed)
Preventing Unhealthy Weight Gain, Adult Staying at a healthy weight is important to your overall health. When fat builds up in your body, you may become overweight or obese. Being overweight or obese increases your risk of developing certain health problems, such as heart disease, diabetes, sleeping problems, joint problems, and some types of cancer. Unhealthy weight gain is often the result of making unhealthy food choices or not getting enough exercise. You can make changes to your lifestyle to prevent obesity and stay as healthy as possible. What nutrition changes can be made?  Eat only as much as your body needs. To do this: ? Pay attention to signs that you are hungry or full. Stop eating as soon as you feel full. ? If you feel hungry, try drinking water first before eating. Drink enough water so your urine is clear or pale yellow. ? Eat smaller portions. Pay attention to portion sizes when eating out. ? Look at serving sizes on food labels. Most foods contain more than one serving per container. ? Eat the recommended number of calories for your gender and activity level. For most active people, a daily total of 2,000 calories is appropriate. If you are trying to lose weight or are not very active, you may need to eat fewer calories. Talk with your health care provider or a diet and nutrition specialist (dietitian) about how many calories you need each day.  Choose healthy foods, such as: ? Fruits and vegetables. At each meal, try to fill at least half of your plate with fruits and vegetables. ? Whole grains, such as whole-wheat bread, brown rice, and quinoa. ? Lean meats, such as chicken or fish. ? Other healthy proteins, such as beans, eggs, or tofu. ? Healthy fats, such as nuts, seeds, fatty fish, and olive oil. ? Low-fat or fat-free dairy products.  Check food labels, and avoid food and drinks that: ? Are high in calories. ? Have added sugar. ? Are high in sodium. ? Have saturated  fats or trans fats.  Cook foods in healthier ways, such as by baking, broiling, or grilling.  Make a meal plan for the week, and shop with a grocery list to help you stay on track with your purchases. Try to avoid going to the grocery store when you are hungry.  When grocery shopping, try to shop around the outside of the store first, where the fresh foods are. Doing this helps you to avoid prepackaged foods, which can be high in sugar, salt (sodium), and fat.   What lifestyle changes can be made?  Exercise for 30 or more minutes on 5 or more days each week. Exercising may include brisk walking, yard work, biking, running, swimming, and team sports like basketball and soccer. Ask your health care provider which exercises are safe for you.  Do muscle-strengthening activities, such as lifting weights or using resistance bands, on 2 or more days a week.  Do not use any products that contain nicotine or tobacco, such as cigarettes and e-cigarettes. If you need help quitting, ask your health care provider.  Limit alcohol intake to no more than 1 drink a day for nonpregnant women and 2 drinks a day for men. One drink equals 12 oz of beer, 5 oz of wine, or 1 oz of hard liquor.  Try to get 7-9 hours of sleep each night.   What other changes can be made?  Keep a food and activity journal to keep track of: ? What you ate and how   many calories you had. Remember to count the calories in sauces, dressings, and side dishes. ? Whether you were active, and what exercises you did. ? Your calorie, weight, and activity goals.  Check your weight regularly. Track any changes. If you notice you have gained weight, make changes to your diet or activity routine.  Avoid taking weight-loss medicines or supplements. Talk to your health care provider before starting any new medicine or supplement.  Talk to your health care provider before trying any new diet or exercise plan. Why are these changes  important? Eating healthy, staying active, and having healthy habits can help you to prevent obesity. Those changes also:  Help you manage stress and emotions.  Help you connect with friends and family.  Improve your self-esteem.  Improve your sleep.  Prevent long-term health problems. What can happen if changes are not made? Being obese or overweight can cause you to develop joint or bone problems, which can make it hard for you to stay active or do activities you enjoy. Being obese or overweight also puts stress on your heart and lungs and can lead to health problems like diabetes, heart disease, and some cancers. Where to find more information Talk with your health care provider or a dietitian about healthy eating and healthy lifestyle choices. You may also find information from:  [place] Department of Agriculture, MyPlate: www.choosemyplate.gov  American Heart Association: www.heart.org  Centers for Disease Control and Prevention: www.cdc.gov Summary  Staying at a healthy weight is important to your overall health. It helps you to prevent certain diseases and health problems, such as heart disease, diabetes, joint problems, sleep disorders, and some types of cancer.  Being obese or overweight can cause you to develop joint or bone problems, which can make it hard for you to stay active or do activities you enjoy.  You can prevent unhealthy weight gain by eating a healthy diet, exercising regularly, not smoking, limiting alcohol, and getting enough sleep.  Talk with your health care provider or a dietitian for guidance about healthy eating and healthy lifestyle choices. This information is not intended to replace advice given to you by your health care provider. Make sure you discuss any questions you have with your health care provider. Document Revised: 02/27/2020 Document Reviewed: 02/27/2020 Elsevier Patient Education  2021 [employer]vier Inc.  

## 2021-04-22 NOTE — Telephone Encounter (Signed)
Pt was seen in the office on 04/20/2021

## 2021-04-27 ENCOUNTER — Other Ambulatory Visit: Payer: Self-pay

## 2021-04-27 ENCOUNTER — Encounter: Payer: 59 | Admitting: Obstetrics and Gynecology

## 2021-04-28 ENCOUNTER — Encounter: Payer: 59 | Admitting: Obstetrics and Gynecology

## 2021-05-20 ENCOUNTER — Other Ambulatory Visit: Payer: Self-pay

## 2021-06-28 NOTE — Progress Notes (Signed)
    GYNECOLOGY PROGRESS NOTE  Subjective:    Patient ID: DEMECIA NORTHWAY, female    DOB: [birth year], 35 y.o.   MRN: [id number]  HPI  Patient is a 35 y.o. G23P2002 female who presents for 2 month weight management follow up. She has a past history of obesity.  She initiated use of Phentermine 3 months ago.  Denies any undesirable side effects and reports compliance with medications. Has been off medication for a week as she ran out of medication and appointment was rescheduled. Also reports a vacation earlier this month where she was a little less diligent in her routine for ~ 1 week, but has returned to normal.    Current interventions:  1. Diet - limiting carbs, increasing protein. Drinking ~ 2 L of water per day.  2. Activity - riding [place] bike 5-7 days per week.  3. Reports bowel movements are normal.    The following portions of the patient's history were reviewed and updated as appropriate: allergies, current medications, past family history, past medical history, past social history, past surgical history, and problem list.  Review of Systems Pertinent items noted in HPI and remainder of comprehensive ROS otherwise negative.   Objective:    Vitals with BMI 06/30/2021 04/20/2021 03/16/2021  Height 5\' 4"  5\' 4"  5\' 4"   Weight 166 lbs 8 oz 173 lbs 11 oz 179 lbs  BMI 28.57 29.8 30.71  Systolic 104 116  Diastolic 72 78 73  Pulse 93 80 72    General appearance: alert, cooperative, appears stated age, and no distress Abdomen: soft, non-tender.  Waist circumference 36 in.    Labs:   Assessment:   Weight management Overweight, Body mass index is 28.58 kg/m.   Plan:   Weight management  - patient notes that she would like to continue with weight loss management for the next month or two until she reaches her goal weight (at least 20 lbs).  Offered increasing Phentermine dose to 37.5 but patient declines, fearful that she will have issues with sleep and jitteriness. Can  continue current management.  Will refill for additional 2 months. Will return for final weight check after this.    A total of 15 minutes were spent face-to-face with the patient during this encounter and over half of that time dealt with counseling and coordination of care.   , MD [facility]

## 2021-06-29 ENCOUNTER — Encounter: Payer: 59 | Admitting: Obstetrics and Gynecology

## 2021-06-30 ENCOUNTER — Other Ambulatory Visit: Payer: Self-pay

## 2021-06-30 ENCOUNTER — Ambulatory Visit (INDEPENDENT_AMBULATORY_CARE_PROVIDER_SITE_OTHER): Payer: 59 | Admitting: Obstetrics and Gynecology

## 2021-06-30 ENCOUNTER — Encounter: Payer: Self-pay | Admitting: Obstetrics and Gynecology

## 2021-06-30 VITALS — BP 104/72 | HR 93 | Ht 64.0 in | Wt 166.5 lb

## 2021-06-30 DIAGNOSIS — Z7689 Persons encountering health services in other specified circumstances: Secondary | ICD-10-CM | POA: Diagnosis not present

## 2021-06-30 DIAGNOSIS — E663 Overweight: Secondary | ICD-10-CM | POA: Diagnosis not present

## 2021-06-30 MED ORDER — PHENTERMINE HCL 30 MG PO CAPS
30.0000 mg | ORAL_CAPSULE | ORAL | 1 refills | Status: DC
  Filled 2021-06-30: qty 30, 30d supply, fill #0
  Filled 2021-07-30: qty 30, 30d supply, fill #1

## 2021-06-30 NOTE — Patient Instructions (Signed)
Preventing Unhealthy Weight Gain, Adult Staying at a healthy weight is important to your overall health. When fat builds up in your body, you may become overweight or obese. Being overweight or obese increases your risk of developing certain health problems, such as heartdisease, diabetes, sleeping problems, joint problems, and some types of cancer. Unhealthy weight gain is often the result of making unhealthy food choices or not getting enough exercise. You can make changes to your lifestyle to preventobesity and stay as healthy as possible. What nutrition changes can be made?  Eat only as much as your body needs. To do this: Pay attention to signs that you are hungry or full. Stop eating as soon as you feel full. If you feel hungry, try drinking water first before eating. Drink enough water so your urine is clear or pale yellow. Eat smaller portions. Pay attention to portion sizes when eating out. Look at serving sizes on food labels. Most foods contain more than one serving per container. Eat the recommended number of calories for your gender and activity level. For most active people, a daily total of 2,000 calories is appropriate. If you are trying to lose weight or are not very active, you may need to eat fewer calories. Talk with your health care provider or a diet and nutrition specialist (dietitian) about how many calories you need each day. Choose healthy foods, such as: Fruits and vegetables. At each meal, try to fill at least half of your plate with fruits and vegetables. Whole grains, such as whole-wheat bread, brown rice, and quinoa. Lean meats, such as chicken or fish. Other healthy proteins, such as beans, eggs, or tofu. Healthy fats, such as nuts, seeds, fatty fish, and olive oil. Low-fat or fat-free dairy products. Check food labels, and avoid food and drinks that: Are high in calories. Have added sugar. Are high in sodium. Have saturated fats or trans fats. Cook foods in  healthier ways, such as by baking, broiling, or grilling. Make a meal plan for the week, and shop with a grocery list to help you stay on track with your purchases. Try to avoid going to the grocery store when you are hungry. When grocery shopping, try to shop around the outside of the store first, where the fresh foods are. Doing this helps you to avoid prepackaged foods, which can be high in sugar, salt (sodium), and fat. What lifestyle changes can be made?  Exercise for 30 or more minutes on 5 or more days each week. Exercising may include brisk walking, yard work, biking, running, swimming, and team sports like basketball and soccer. Ask your health care provider which exercises are safe for you. Do muscle-strengthening activities, such as lifting weights or using resistance bands, on 2 or more days a week. Do not use any products that contain nicotine or tobacco, such as cigarettes and e-cigarettes. If you need help quitting, ask your health care provider. Limit alcohol intake to no more than 1 drink a day for nonpregnant women and 2 drinks a day for men. One drink equals 12 oz of beer, 5 oz of wine, or 1 oz of hard liquor. Try to get 7-9 hours of sleep each night. What other changes can be made? Keep a food and activity journal to keep track of: What you ate and how many calories you had. Remember to count the calories in sauces, dressings, and side dishes. Whether you were active, and what exercises you did. Your calorie, weight, and activity goals. Check your   weight regularly. Track any changes. If you notice you have gained weight, make changes to your diet or activity routine. Avoid taking weight-loss medicines or supplements. Talk to your health care provider before starting any new medicine or supplement. Talk to your health care provider before trying any new diet or exercise plan. Why are these changes important? Eating healthy, staying active, and having healthy habits can help you  to prevent obesity. Those changes also: Help you manage stress and emotions. Help you connect with friends and family. Improve your self-esteem. Improve your sleep. Prevent long-term health problems. What can happen if changes are not made? Being obese or overweight can cause you to develop joint or bone problems, which can make it hard for you to stay active or do activities you enjoy. Being obese or overweight also puts stress on your heart and lungs and can lead tohealth problems like diabetes, heart disease, and some cancers. Where to find more information Talk with your health care provider or a dietitian about healthy eating and healthy lifestyle choices. You may also find information from: [place]. Department of Agriculture, MyPlate: www.choosemyplate.gov American Heart Association: www.heart.org Centers for Disease Control and Prevention: www.cdc.gov Summary Staying at a healthy weight is important to your overall health. It helps you to prevent certain diseases and health problems, such as heart disease, diabetes, joint problems, sleep disorders, and some types of cancer. Being obese or overweight can cause you to develop joint or bone problems, which can make it hard for you to stay active or do activities you enjoy. You can prevent unhealthy weight gain by eating a healthy diet, exercising regularly, not smoking, limiting alcohol, and getting enough sleep. Talk with your health care provider or a dietitian for guidance about healthy eating and healthy lifestyle choices. This information is not intended to replace advice given to you by your health care provider. Make sure you discuss any questions you have with your healthcare provider. Document Revised: 02/27/2020 Document Reviewed: 02/27/2020 Elsevier Patient Education  2022 [employer]  

## 2021-07-30 ENCOUNTER — Other Ambulatory Visit: Payer: Self-pay

## 2021-08-09 ENCOUNTER — Other Ambulatory Visit: Payer: Self-pay

## 2021-09-01 ENCOUNTER — Encounter: Payer: Self-pay | Admitting: Obstetrics and Gynecology

## 2021-09-01 ENCOUNTER — Other Ambulatory Visit: Payer: Self-pay

## 2021-09-01 ENCOUNTER — Ambulatory Visit (INDEPENDENT_AMBULATORY_CARE_PROVIDER_SITE_OTHER): Payer: 59 | Admitting: Obstetrics and Gynecology

## 2021-09-01 VITALS — BP 110/71 | HR 81 | Ht 64.0 in | Wt 160.0 lb

## 2021-09-01 DIAGNOSIS — E663 Overweight: Secondary | ICD-10-CM

## 2021-09-01 DIAGNOSIS — Z7689 Persons encountering health services in other specified circumstances: Secondary | ICD-10-CM | POA: Diagnosis not present

## 2021-09-01 MED ORDER — PHENTERMINE HCL 30 MG PO CAPS
30.0000 mg | ORAL_CAPSULE | ORAL | 0 refills | Status: DC
  Filled 2021-09-01: qty 30, 30d supply, fill #0

## 2021-09-01 MED ORDER — FLUTICASONE PROPIONATE 50 MCG/ACT NA SUSP
2.0000 | Freq: Every day | NASAL | 6 refills | Status: DC
  Filled 2021-09-01: qty 16, 30d supply, fill #0
  Filled 2022-02-22: qty 16, 30d supply, fill #1
  Filled 2022-06-24: qty 16, 30d supply, fill #2

## 2021-09-01 NOTE — Patient Instructions (Signed)
Preventing Unhealthy Weight Gain, Adult Staying at a healthy weight is important to your overall health. When fat builds up in your body, you may become overweight or obese. Being overweight or obese increases your risk of developing certain health problems, such as heart disease, diabetes, sleeping problems, joint problems, and some types of cancer. Unhealthy weight gain is often the result of making unhealthy food choices or not getting enough exercise. You can make changes to your lifestyle to prevent obesity and stay as healthy as possible. What nutrition changes can be made?  Eat only as much as your body needs. To do this: Pay attention to signs that you are hungry or full. Stop eating as soon as you feel full. If you feel hungry, try drinking water first before eating. Drink enough water so your urine is clear or pale yellow. Eat smaller portions. Pay attention to portion sizes when eating out. Look at serving sizes on food labels. Most foods contain more than one serving per container. Eat the recommended number of calories for your gender and activity level. For most active people, a daily total of 2,000 calories is appropriate. If you are trying to lose weight or are not very active, you may need to eat fewer calories. Talk with your health care provider or a diet and nutrition specialist (dietitian) about how many calories you need each day. Choose healthy foods, such as: Fruits and vegetables. At each meal, try to fill at least half of your plate with fruits and vegetables. Whole grains, such as whole-wheat bread, brown rice, and quinoa. Lean meats, such as chicken or fish. Other healthy proteins, such as beans, eggs, or tofu. Healthy fats, such as nuts, seeds, fatty fish, and olive oil. Low-fat or fat-free dairy products. Check food labels, and avoid food and drinks that: Are high in calories. Have added sugar. Are high in sodium. Have saturated fats or trans fats. Cook foods in  healthier ways, such as by baking, broiling, or grilling. Make a meal plan for the week, and shop with a grocery list to help you stay on track with your purchases. Try to avoid going to the grocery store when you are hungry. When grocery shopping, try to shop around the outside of the store first, where the fresh foods are. Doing this helps you to avoid prepackaged foods, which can be high in sugar, salt (sodium), and fat. What lifestyle changes can be made?  Exercise for 30 or more minutes on 5 or more days each week. Exercising may include brisk walking, yard work, biking, running, swimming, and team sports like basketball and soccer. Ask your health care provider which exercises are safe for you. Do muscle-strengthening activities, such as lifting weights or using resistance bands, on 2 or more days a week. Do not use any products that contain nicotine or tobacco, such as cigarettes and e-cigarettes. If you need help quitting, ask your health care provider. Limit alcohol intake to no more than 1 drink a day for nonpregnant women and 2 drinks a day for men. One drink equals 12 oz of beer, 5 oz of wine, or 1 oz of hard liquor. Try to get 7-9 hours of sleep each night. What other changes can be made? Keep a food and activity journal to keep track of: What you ate and how many calories you had. Remember to count the calories in sauces, dressings, and side dishes. Whether you were active, and what exercises you did. Your calorie, weight, and activity goals.   Check your weight regularly. Track any changes. If you notice you have gained weight, make changes to your diet or activity routine. Avoid taking weight-loss medicines or supplements. Talk to your health care provider before starting any new medicine or supplement. Talk to your health care provider before trying any new diet or exercise plan. Why are these changes important? Eating healthy, staying active, and having healthy habits can help you  to prevent obesity. Those changes also: Help you manage stress and emotions. Help you connect with friends and family. Improve your self-esteem. Improve your sleep. Prevent long-term health problems. What can happen if changes are not made? Being obese or overweight can cause you to develop joint or bone problems, which can make it hard for you to stay active or do activities you enjoy. Being obese or overweight also puts stress on your heart and lungs and can lead to health problems like diabetes, heart disease, and some cancers. Where to find more information Talk with your health care provider or a dietitian about healthy eating and healthy lifestyle choices. You may also find information from: [place]. Department of Agriculture, MyPlate: www.choosemyplate.gov American Heart Association: www.heart.org Centers for Disease Control and Prevention: www.cdc.gov Summary Staying at a healthy weight is important to your overall health. It helps you to prevent certain diseases and health problems, such as heart disease, diabetes, joint problems, sleep disorders, and some types of cancer. Being obese or overweight can cause you to develop joint or bone problems, which can make it hard for you to stay active or do activities you enjoy. You can prevent unhealthy weight gain by eating a healthy diet, exercising regularly, not smoking, limiting alcohol, and getting enough sleep. Talk with your health care provider or a dietitian for guidance about healthy eating and healthy lifestyle choices. This information is not intended to replace advice given to you by your health care provider. Make sure you discuss any questions you have with your health care provider. Document Revised: 02/10/2020 Document Reviewed: 02/27/2020 Elsevier Patient Education  2022 [employer] Inc.  

## 2021-09-01 NOTE — Progress Notes (Signed)
    GYNECOLOGY PROGRESS NOTE  Subjective:    Patient ID: Samantha Blackwell, female    DOB: 1986-08-17, 35 y.o.   MRN: [id number]  HPI  Patient is a 35 y.o. G28P2002 female who presents for 2 month weight management follow up. She has a past history of obesity.  She initiated use of Phentermine 5 months ago.  Denies any undesirable side effects and reports compliance with medications.    Current interventions:  1. Diet - limiting carbs, increasing protein. Drinking ~ 2 L of water per day.  2. Activity - riding [place] bike 5-7 days per week. Also occasionally doing some walking. 3. Reports bowel movements are normal.    The following portions of the patient's history were reviewed and updated as appropriate: allergies, current medications, past family history, past medical history, past social history, past surgical history, and problem list.   Review of Systems Pertinent items noted in HPI and remainder of comprehensive ROS otherwise negative.   Objective:    Vitals with BMI 09/01/2021 06/30/2021 04/20/2021  Height 5\' 4"  5\' 4"  5\' 4"   Weight 160 lbs 166 lbs 8 oz 173 lbs 11 oz  BMI 27.45 28.57 29.8  Systolic 110 104  Diastolic 71 72 78  Pulse 81 93 80    General appearance: alert, cooperative, appears stated age, and no distress Abdomen: soft, non-tender.  Waist circumference 34 in.    Labs:   Assessment:   Weight management Overweight, Body mass index is 27.46 kg/m.   Plan:   Weight management  - patient notes that she would like to continue with weight loss management for 1 final month. Has almost reached her weight goal  (20 lbs) and is concerned regarding the upcoming holiday next month. Will allow to continue for 1 additional month. No further visits needed.     , MD [facility]

## 2021-10-29 ENCOUNTER — Other Ambulatory Visit: Payer: Self-pay

## 2021-12-03 ENCOUNTER — Other Ambulatory Visit: Payer: Self-pay

## 2022-01-19 ENCOUNTER — Other Ambulatory Visit: Payer: Self-pay

## 2022-02-22 ENCOUNTER — Other Ambulatory Visit: Payer: Self-pay

## 2022-03-30 ENCOUNTER — Other Ambulatory Visit: Payer: Self-pay | Admitting: Obstetrics and Gynecology

## 2022-03-30 ENCOUNTER — Other Ambulatory Visit: Payer: Self-pay

## 2022-03-31 ENCOUNTER — Other Ambulatory Visit: Payer: Self-pay

## 2022-03-31 MED ORDER — PANTOPRAZOLE SODIUM 20 MG PO TBEC
DELAYED_RELEASE_TABLET | Freq: Two times a day (BID) | ORAL | 3 refills | Status: DC
  Filled 2022-03-31: qty 60, 30d supply, fill #0
  Filled 2022-06-06: qty 60, 30d supply, fill #1
  Filled 2022-07-04: qty 60, 30d supply, fill #2
  Filled 2022-10-11: qty 60, 30d supply, fill #3

## 2022-03-31 MED ORDER — LO LOESTRIN FE 1 MG-10 MCG / 10 MCG PO TABS
1.0000 | ORAL_TABLET | Freq: Every day | ORAL | 3 refills | Status: DC
  Filled 2022-03-31: qty 84, 84d supply, fill #0
  Filled 2022-07-04: qty 84, 84d supply, fill #1
  Filled 2022-11-28: qty 84, 84d supply, fill #2
  Filled 2023-02-02 – 2023-02-06 (×3): qty 84, 84d supply, fill #3

## 2022-03-31 NOTE — Telephone Encounter (Signed)
OCP refilled per pt request

## 2022-04-13 ENCOUNTER — Encounter: Payer: 59 | Admitting: Obstetrics and Gynecology

## 2022-05-05 ENCOUNTER — Other Ambulatory Visit: Payer: Self-pay

## 2022-06-06 ENCOUNTER — Other Ambulatory Visit: Payer: Self-pay

## 2022-06-23 ENCOUNTER — Encounter: Payer: 59 | Admitting: Obstetrics and Gynecology

## 2022-06-24 ENCOUNTER — Other Ambulatory Visit: Payer: Self-pay

## 2022-06-29 ENCOUNTER — Encounter: Payer: 59 | Admitting: Obstetrics and Gynecology

## 2022-07-04 ENCOUNTER — Other Ambulatory Visit: Payer: Self-pay

## 2022-07-27 NOTE — Progress Notes (Signed)
GYNECOLOGY ANNUAL PHYSICAL EXAM PROGRESS NOTE Subjective:    Samantha Blackwell is a 36 y.o. G5P1001 female who presents for an annual exam.  The patient is sexually active, married. Does the patient wear seatbelts: yes. The patient participates in regular exercise: yes (5-6 days per week). Has the patient ever been transfused or tattooed?: no. [name]  reports that there is not domestic violence in her life.   The patient has the following complaints today:  1. Desires to discuss weight loss.  Lost ~ 20 lbs with use of Phentermine last year along with diet and exercise.  Since stopping the medication she reports that continues to eat healthy,  does not eat out much. Continues to exercise on her [place] and walking during her lunch break regularly, but despite efforts she has gained almost all of her weight back. Does note stressful work environment which she feels may be having an impact on her weight.  Drinks plenty of water.    Menstrual History: Menarche age: 20 No LMP recorded. (Menstrual status: Oral contraceptives).    Period Cycle (Days): 28 Period Duration (Days): 4-5 Period Pattern: Regular Menstrual Flow: Light Menstrual Control: Tampon Menstrual Control Change Freq (Hours): 3-4 Dysmenorrhea: (!) Mild Dysmenorrhea Symptoms: Cramping     Gynecologic History:  Contraception: combined OCPs Last pap smear: 5/302022. Abnormal pap smears Denies history of STIs.     Upstream - 07/28/22 1124       Pregnancy Intention Screening   Does the patient want to become pregnant in the next year? No    Does the patient's partner want to become pregnant in the next year? No    Would the patient like to discuss contraceptive options today? Yes      Contraception Wrap Up   Current Method Oral Contraceptive    End Method Oral Contraceptive    How was the end contraceptive method provided? Prescription            The pregnancy intention screening data noted above was reviewed.  Potential methods of contraception were discussed. The patient elected to proceed with Oral Contraceptive.   OB History  Gravida Para Term Preterm AB Living  2 2 2     2   SAB IAB Ectopic Multiple Live Births        0 2    # Outcome Date GA Lbr Len/2nd Weight Sex Delivery Anes PTL Lv  2 Term 08/29/18 [redacted]w[redacted]d  8 lb 3.9 oz (3.74 kg) F Vag-Vacuum EPI N LIV  1 Term 04/26/15 [redacted]w[redacted]d 03:01 / 00:51 7 lb 5.8 oz (3.34 kg) M Vag-Spont None  LIV     Past Medical History:  Diagnosis Date   Dysmenorrhea    GERD (gastroesophageal reflux disease)    History of kidney stones 05/2015   History of ovarian cyst     Family History  Problem Relation Age of Onset   Prostate cancer Father    Cancer Maternal Grandmother        Breast cancer(reoccurence in contralateral breast in aug 2018) , ovarian cancer (sept 2018)   Breast cancer Maternal Grandmother    Breast cancer Maternal Aunt    Bladder Cancer Neg Hx    Kidney cancer Neg Hx     Past Surgical History:  Procedure Laterality Date   NO PAST SURGERIES      Social History   Socioeconomic History   Marital status: Married    Spouse name: Not on file   Number of children: Not  on file   Years of education: Not on file   Highest education level: Not on file  Occupational History   Not on file  Tobacco Use   Smoking status: Never   Smokeless tobacco: Never  Vaping Use   Vaping Use: Never used  Substance and Sexual Activity   Alcohol use: No    Alcohol/week: 0.0 standard drinks of alcohol   Drug use: No   Sexual activity: Yes    Birth control/protection: Pill  Other Topics Concern   Not on file  Social History Narrative   Not on file   Social Determinants of Health   Financial Resource Strain: Not on file  Food Insecurity: Not on file  Transportation Needs: Not on file  Physical Activity: Not on file  Stress: Not on file  Social Connections: Not on file  Intimate Partner Violence: Not on file    Current Outpatient  Medications on File Prior to Visit  Medication Sig Dispense Refill   cetirizine (ZYRTEC) 10 MG tablet Take 10 mg by mouth daily.     fluticasone (FLONASE) 50 MCG/ACT nasal spray Place 2 sprays into both nostrils daily. 16 g 6   Norethindrone-Ethinyl Estradiol-Fe Biphas (LO LOESTRIN FE) 1 MG-10 MCG / 10 MCG tablet Take 1 tablet by mouth daily. 84 tablet 3   pantoprazole (PROTONIX) 20 MG tablet TAKE 1 TABLET BY MOUTH TWICE DAILY 60 tablet 3   phentermine 30 MG capsule Take 1 capsule (30 mg total) by mouth every morning. 30 capsule 0   No current facility-administered medications on file prior to visit.    Allergies  Allergen Reactions   Penicillins Rash     Review of Systems  Constitutional: negative for chills, fatigue, fevers and sweats. Positive for weight gain.  Eyes: negative for irritation, redness and visual disturbance Ears, nose, mouth, throat, and face: negative for hearing loss, nasal congestion, snoring and tinnitus Respiratory: negative for asthma, cough, sputum Cardiovascular: negative for chest pain, dyspnea, exertional chest pressure/discomfort, irregular heart beat, palpitations and syncope Gastrointestinal: negative for abdominal pain, change in bowel habits, nausea and vomiting Genitourinary: negative for abnormal menstrual periods, genital lesions, sexual problems and vaginal discharge, dysuria and urinary incontinence Integument/breast: negative for breast lump, breast tenderness and nipple discharge Hematologic/lymphatic: negative for bleeding and easy bruising Musculoskeletal:negative for back pain and muscle weakness Neurological: negative for dizziness, headaches, vertigo and weakness Endocrine: negative for diabetic symptoms including polydipsia, polyuria and skin dryness Allergic/Immunologic: negative for hay fever and urticaria    Objective:    BP 124/85   Pulse 65   Resp 16   Ht 5\' 4"  (1.626 m)   Wt 179 lb 8 oz (81.4 kg)   LMP 07/15/2022 (Approximate)    BMI 30.81 kg/m .  Body mass index is 30.81 kg/m.  General Appearance:    Alert, cooperative, no distress, appears stated age, mild obesity  Head:    Normocephalic, without obvious abnormality, atraumatic  Eyes:    PERRL, conjunctiva/corneas clear, EOM's intact, both eyes  Ears:    Normal external ear canals, both ears  Nose:   Nares normal, septum midline, mucosa normal, no drainage or sinus tenderness  Throat:   Lips, mucosa, and tongue normal; teeth and gums normal  Neck:   Supple, symmetrical, trachea midline, no adenopathy; thyroid: no enlargement/tenderness/nodules; no carotid bruit or JVD  Back:     Symmetric, no curvature, ROM normal, no CVA tenderness  Lungs:     Clear to auscultation bilaterally, respirations unlabored  Chest Wall:    No tenderness or deformity   Heart:    Regular rate and rhythm, S1 and S2 normal, no murmur, rub or gallop  Breast Exam:    No tenderness, masses, or nipple abnormality  Abdomen:     Soft, non-tender, bowel sounds active all four quadrants, no masses, no organomegaly.   Genitalia:    Deferred.   Extremities:   Extremities normal, atraumatic, no cyanosis or edema  Pulses:   2+ and symmetric all extremities  Skin:   Skin color, texture, turgor normal, no rashes or lesions  Lymph nodes:   Cervical, supraclavicular, and axillary nodes normal  Neurologic:   CNII-XII intact, normal strength, sensation and reflexes throughout     Lab Results  Component Value Date   WBC 11.2 (H) 08/30/2018   HGB 8.7 (L) 08/30/2018   HCT 26.3 (L) 08/30/2018   MCV 89.5 08/30/2018   PLT 225 08/30/2018    Lab Results  Component Value Date   CREATININE 0.58 06/07/2018   BUN 7 06/07/2018   NA 138 06/07/2018   K 3.4 (L) 06/07/2018   CL 107 06/07/2018   CO2 23 06/07/2018    Lab Results  Component Value Date   ALT 13 07/14/2015   AST 14 07/14/2015   ALKPHOS 91 07/14/2015   BILITOT 0.3 07/14/2015    No results found for: "CHOL", "HDL", "LDLCALC",  "LDLDIRECT", "TRIG", "CHOLHDL"   Assessment:   1. Well woman exam with routine gynecological exam   2. Obesity (BMI 30.0-34.9)   3. Encounter for surveillance of contraceptive pills     Plan:    - Labs: see orders. Patient will return for fasting labs.  - Breast self exam technique reviewed and patient encouraged to perform self-exam monthly. - Family history of breast cancer. Will get baseline mammogram for family history of breast cancer at age 89.  Recommend routine screening starting at age 41.  Also previously offered hereditary cancer screening, patient declined. - Contraception: OCP (estrogen/progesterone).  - Pap smear up to date. - Discussed weight loss management options with patient. Patient has tried a trial of phentermine before, is willing to try it again as it was helpful for her weight loss.  Declines use of medications that require injections. Also discussed stress management, as long-term stress can make weight loss more difficult and lead to bloating. - Patient desires for her and her husband to get established with a PCP. Given recommendations on providers/practices accepting patients.  - Follow up in 1 year, or sooner as needed for annual exam.  Follow up in 1 month (televisit) for weight loss follow up.    Rubie Maid, MD [facility]

## 2022-07-28 ENCOUNTER — Other Ambulatory Visit: Payer: Self-pay

## 2022-07-28 ENCOUNTER — Encounter: Payer: Self-pay | Admitting: Obstetrics and Gynecology

## 2022-07-28 ENCOUNTER — Ambulatory Visit (INDEPENDENT_AMBULATORY_CARE_PROVIDER_SITE_OTHER): Payer: 59 | Admitting: Obstetrics and Gynecology

## 2022-07-28 VITALS — BP 124/85 | HR 65 | Resp 16 | Ht 64.0 in | Wt 179.5 lb

## 2022-07-28 DIAGNOSIS — E66811 Obesity, class 1: Secondary | ICD-10-CM

## 2022-07-28 DIAGNOSIS — Z01419 Encounter for gynecological examination (general) (routine) without abnormal findings: Secondary | ICD-10-CM

## 2022-07-28 DIAGNOSIS — E669 Obesity, unspecified: Secondary | ICD-10-CM

## 2022-07-28 DIAGNOSIS — Z3041 Encounter for surveillance of contraceptive pills: Secondary | ICD-10-CM

## 2022-07-28 MED ORDER — PHENTERMINE HCL 30 MG PO CAPS
30.0000 mg | ORAL_CAPSULE | ORAL | 2 refills | Status: DC
  Filled 2022-07-28: qty 30, 30d supply, fill #0
  Filled 2022-09-01: qty 30, 30d supply, fill #1
  Filled 2022-10-04: qty 30, 30d supply, fill #2

## 2022-07-28 NOTE — Patient Instructions (Signed)
Breast Self-Awareness Breast self-awareness is knowing how your breasts look and feel. You need to: Check your breasts on a regular basis. Tell your doctor about any changes. Become familiar with the look and feel of your breasts. This can help you catch a breast problem while it is still small and can be treated. You should do breast self-exams even if you have breast implants. What you need: A mirror. A well-lit room. A pillow or other soft object. How to do a breast self-exam Follow these steps to do a breast self-exam: Look for changes  Take off all the clothes above your waist. Stand in front of a mirror in a room with good lighting. Put your hands down at your sides. Compare your breasts in the mirror. Look for any difference between them, such as: A difference in shape. A difference in size. Wrinkles, dips, and bumps in one breast and not the other. Look at each breast for changes in the skin, such as: Redness. Scaly areas. Skin that has gotten thicker. Dimpling. Open sores (ulcers). Look for changes in your nipples, such as: Fluid coming out of a nipple. Fluid around a nipple. Bleeding. Dimpling. Redness. A nipple that looks pushed in (retracted), or that has changed position. Feel for changes Lie on your back. Feel each breast. To do this: Pick a breast to feel. Place a pillow under the shoulder closest to that breast. Put the arm closest to that breast behind your head. Feel the nipple area of that breast using the hand of your other arm. Feel the area with the pads of your three middle fingers by making small circles with your fingers. Use light, medium, and firm pressure. Continue the overlapping circles, moving downward over the breast. Keep making circles with your fingers. Stop when you feel your ribs. Start making circles with your fingers again, this time going upward until you reach your collarbone. Then, make circles outward across your breast and into your  armpit area. Squeeze your nipple. Check for discharge and lumps. Repeat these steps to check your other breast. Sit or stand in the tub or shower. With soapy water on your skin, feel each breast the same way you did when you were lying down. Write down what you find Writing down what you find can help you remember what to tell your doctor. Write down: What is normal for each breast. Any changes you find in each breast. These include: The kind of changes you find. A tender or painful breast. Any lump you find. Write down its size and where it is. When you last had your monthly period (menstrual cycle). General tips If you are breastfeeding, the best time to check your breasts is after you feed your baby or after you use a breast pump. If you get monthly bleeding, the best time to check your breasts is 5-7 days after your monthly cycle ends. With time, you will become comfortable with the self-exam. You will also start to know if there are changes in your breasts. Contact a doctor if: You see a change in the shape or size of your breasts or nipples. You see a change in the skin of your breast or nipples, such as red or scaly skin. You have fluid coming from your nipples that is not normal. You find a new lump or thick area. You have breast pain. You have any concerns about your breast health. Summary Breast self-awareness includes looking for changes in your breasts and feeling for changes  within your breasts. You should do breast self-awareness in front of a mirror in a well-lit room. If you get monthly periods (menstrual cycles), the best time to check your breasts is 5-7 days after your period ends. Tell your doctor about any changes you see in your breasts. Changes include changes in size, changes on the skin, painful or tender breasts, or fluid from your nipples that is not normal. This information is not intended to replace advice given to you by your health care provider. Make sure  you discuss any questions you have with your health care provider. Document Revised: 09/02/2021 Document Reviewed: 09/02/2021 Elsevier Patient Education  [place] 4-41 Years Old, Female Preventive care refers to lifestyle choices and visits with your health care provider that can promote health and wellness. Preventive care visits are also called wellness exams. What can I expect for my preventive care visit? Counseling During your preventive care visit, your health care provider may ask about your: Medical history, including: Past medical problems. Family medical history. Pregnancy history. Current health, including: Menstrual cycle. Method of birth control. Emotional well-being. Home life and relationship well-being. Sexual activity and sexual health. Lifestyle, including: Alcohol, nicotine or tobacco, and drug use. Access to firearms. Diet, exercise, and sleep habits. Work and work Statistician. Sunscreen use. Safety issues such as seatbelt and bike helmet use. Physical exam Your health care provider may check your: Height and weight. These may be used to calculate your BMI (body mass index). BMI is a measurement that tells if you are at a healthy weight. Waist circumference. This measures the distance around your waistline. This measurement also tells if you are at a healthy weight and may help predict your risk of certain diseases, such as type 2 diabetes and high blood pressure. Heart rate and blood pressure. Body temperature. Skin for abnormal spots. What immunizations do I need?  Vaccines are usually given at various ages, according to a schedule. Your health care provider will recommend vaccines for you based on your age, medical history, and lifestyle or other factors, such as travel or where you work. What tests do I need? Screening Your health care provider may recommend screening tests for certain conditions. This may include: Pelvic exam  and Pap test. Lipid and cholesterol levels. Diabetes screening. This is done by checking your blood sugar (glucose) after you have not eaten for a while (fasting). Hepatitis B test. Hepatitis C test. HIV (human immunodeficiency virus) test. STI (sexually transmitted infection) testing, if you are at risk. BRCA-related cancer screening. This may be done if you have a family history of breast, ovarian, tubal, or peritoneal cancers. Talk with your health care provider about your test results, treatment options, and if necessary, the need for more tests. Follow these instructions at home: Eating and drinking  Eat a healthy diet that includes fresh fruits and vegetables, whole grains, lean protein, and low-fat dairy products. Take vitamin and mineral supplements as recommended by your health care provider. Do not drink alcohol if: Your health care provider tells you not to drink. You are pregnant, may be pregnant, or are planning to become pregnant. If you drink alcohol: Limit how much you have to 0-1 drink a day. Know how much alcohol is in your drink. In the [place] one drink equals one 12 oz bottle of beer (355 mL), one 5 oz glass of wine (148 mL), or one 1 oz glass of hard liquor (44 mL). Lifestyle Brush your teeth every morning and  night with fluoride toothpaste. Floss one time each day. Exercise for at least 30 minutes 5 or more days each week. Do not use any products that contain nicotine or tobacco. These products include cigarettes, chewing tobacco, and vaping devices, such as e-cigarettes. If you need help quitting, ask your health care provider. Do not use drugs. If you are sexually active, practice safe sex. Use a condom or other form of protection to prevent STIs. If you do not wish to become pregnant, use a form of birth control. If you plan to become pregnant, see your health care provider for a prepregnancy visit. Find healthy ways to manage stress, such as: Meditation, yoga, or  listening to music. Journaling. Talking to a trusted person. Spending time with friends and family. Minimize exposure to UV radiation to reduce your risk of skin cancer. Safety Always wear your seat belt while driving or riding in a vehicle. Do not drive: If you have been drinking alcohol. Do not ride with someone who has been drinking. If you have been using any mind-altering substances or drugs. While texting. When you are tired or distracted. Wear a helmet and other protective equipment during sports activities. If you have firearms in your house, make sure you follow all gun safety procedures. Seek help if you have been physically or sexually abused. What's next? Go to your health care provider once a year for an annual wellness visit. Ask your health care provider how often you should have your eyes and teeth checked. Stay up to date on all vaccines. This information is not intended to replace advice given to you by your health care provider. Make sure you discuss any questions you have with your health care provider. Document Revised: 04/28/2021 Document Reviewed: 04/28/2021 Elsevier Patient Education  [place].

## 2022-09-01 ENCOUNTER — Other Ambulatory Visit: Payer: Self-pay

## 2022-10-04 ENCOUNTER — Other Ambulatory Visit: Payer: Self-pay

## 2022-10-11 ENCOUNTER — Other Ambulatory Visit: Payer: Self-pay

## 2022-10-26 ENCOUNTER — Other Ambulatory Visit: Payer: Self-pay | Admitting: Obstetrics and Gynecology

## 2022-10-26 ENCOUNTER — Other Ambulatory Visit: Payer: Self-pay

## 2022-10-26 ENCOUNTER — Encounter: Payer: Self-pay | Admitting: Pharmacist

## 2022-10-27 ENCOUNTER — Other Ambulatory Visit: Payer: Self-pay

## 2022-10-27 MED ORDER — FLUTICASONE PROPIONATE 50 MCG/ACT NA SUSP
2.0000 | Freq: Every day | NASAL | 6 refills | Status: AC
  Filled 2022-10-27: qty 16, 30d supply, fill #0

## 2022-10-31 ENCOUNTER — Other Ambulatory Visit: Payer: Self-pay | Admitting: Obstetrics and Gynecology

## 2022-11-01 ENCOUNTER — Encounter: Payer: Self-pay | Admitting: Obstetrics and Gynecology

## 2022-11-01 MED ORDER — PHENTERMINE HCL 30 MG PO CAPS
30.0000 mg | ORAL_CAPSULE | ORAL | 0 refills | Status: DC
  Filled 2022-11-01: qty 30, 30d supply, fill #0

## 2022-11-01 NOTE — Telephone Encounter (Signed)
Given a refill, patient needs appointment for weight check.

## 2022-11-02 ENCOUNTER — Other Ambulatory Visit: Payer: Self-pay

## 2022-11-28 ENCOUNTER — Other Ambulatory Visit: Payer: Self-pay

## 2023-01-05 ENCOUNTER — Other Ambulatory Visit: Payer: Self-pay | Admitting: Obstetrics and Gynecology

## 2023-01-06 ENCOUNTER — Other Ambulatory Visit: Payer: Self-pay | Admitting: Obstetrics and Gynecology

## 2023-01-06 ENCOUNTER — Other Ambulatory Visit: Payer: Self-pay

## 2023-01-06 MED FILL — Pantoprazole Sodium EC Tab 20 MG (Base Equiv): ORAL | 30 days supply | Qty: 60 | Fill #0 | Status: AC

## 2023-02-02 ENCOUNTER — Other Ambulatory Visit: Payer: Self-pay

## 2023-02-02 MED FILL — Pantoprazole Sodium EC Tab 20 MG (Base Equiv): ORAL | 30 days supply | Qty: 60 | Fill #1 | Status: AC

## 2023-02-06 ENCOUNTER — Other Ambulatory Visit: Payer: Self-pay

## 2023-03-02 ENCOUNTER — Other Ambulatory Visit: Payer: Self-pay

## 2023-05-08 ENCOUNTER — Other Ambulatory Visit: Payer: Self-pay

## 2023-05-08 ENCOUNTER — Other Ambulatory Visit (HOSPITAL_BASED_OUTPATIENT_CLINIC_OR_DEPARTMENT_OTHER): Payer: Self-pay

## 2023-05-08 ENCOUNTER — Other Ambulatory Visit: Payer: Self-pay | Admitting: Obstetrics and Gynecology

## 2023-05-08 MED ORDER — LO LOESTRIN FE 1 MG-10 MCG / 10 MCG PO TABS
1.0000 | ORAL_TABLET | Freq: Every day | ORAL | 0 refills | Status: DC
  Filled 2023-05-08: qty 84, 84d supply, fill #0

## 2023-05-08 MED FILL — Pantoprazole Sodium EC Tab 20 MG (Base Equiv): ORAL | 30 days supply | Qty: 60 | Fill #2 | Status: AC

## 2023-05-08 NOTE — Telephone Encounter (Signed)
Please call patient and have her schedule an appointment for an annual exam in order to obtain more refills on her birth control.

## 2023-05-08 NOTE — Telephone Encounter (Signed)
The patient was seen 07/28/22 with [facility] for annual wellness. Septembers schedule hasn't been released yet.

## 2023-05-24 ENCOUNTER — Other Ambulatory Visit: Payer: Self-pay

## 2023-06-05 ENCOUNTER — Other Ambulatory Visit: Payer: Self-pay

## 2023-06-05 MED FILL — Pantoprazole Sodium EC Tab 20 MG (Base Equiv): ORAL | 30 days supply | Qty: 60 | Fill #3 | Status: AC

## 2023-07-31 ENCOUNTER — Other Ambulatory Visit: Payer: Self-pay

## 2023-07-31 ENCOUNTER — Other Ambulatory Visit: Payer: Self-pay | Admitting: Obstetrics and Gynecology

## 2023-08-01 ENCOUNTER — Telehealth: Payer: Self-pay

## 2023-08-01 ENCOUNTER — Other Ambulatory Visit: Payer: Self-pay

## 2023-08-01 MED ORDER — LO LOESTRIN FE 1 MG-10 MCG / 10 MCG PO TABS
1.0000 | ORAL_TABLET | Freq: Every day | ORAL | 0 refills | Status: DC
  Filled 2023-08-01: qty 84, 84d supply, fill #0

## 2023-08-01 NOTE — Telephone Encounter (Signed)
Pt calling; has scheduled appt in Oct; needs refill.  [phone]  Pt aware refill was called in to pharm's vm; to call to be sure it's ready before p/u.

## 2023-08-03 ENCOUNTER — Other Ambulatory Visit: Payer: Self-pay

## 2023-08-03 ENCOUNTER — Telehealth: Payer: 59 | Admitting: Physician Assistant

## 2023-08-03 DIAGNOSIS — J302 Other seasonal allergic rhinitis: Secondary | ICD-10-CM

## 2023-08-03 MED ORDER — LEVOCETIRIZINE DIHYDROCHLORIDE 5 MG PO TABS
5.0000 mg | ORAL_TABLET | Freq: Every evening | ORAL | 0 refills | Status: DC
  Filled 2023-08-03: qty 30, 30d supply, fill #0

## 2023-08-03 NOTE — Progress Notes (Signed)
I have spent 5 minutes in review of e-visit questionnaire, review and updating patient chart, medical decision making and response to patient.   Mia Milan Cody Jacklynn Dehaas, PA-C    

## 2023-08-03 NOTE — Progress Notes (Signed)
E visit for Allergic Rhinitis We are sorry that you are not feeling well.  Here is how we plan to help!  Based on what you have shared with me it looks like you have Allergic Rhinitis.  Rhinitis is when a reaction occurs that causes nasal congestion, runny nose, sneezing, and itching.  Most types of rhinitis are caused by an inflammation and are associated with symptoms in the eyes ears or throat. There are several types of rhinitis.  The most common are acute rhinitis, which is usually caused by a viral illness, allergic or seasonal rhinitis, and nonallergic or year-round rhinitis.  Nasal allergies occur certain times of the year.  Allergic rhinitis is caused when allergens in the air trigger the release of histamine in the body.  Histamine causes itching, swelling, and fluid to build up in the fragile linings of the nasal passages, sinuses and eyelids.  An itchy nose and clear discharge are common.  I recommend the following over the counter treatments: You should take a daily dose of antihistamine. I recommend switching to Xyzal once daily. I have sent in a prescription for this to make it cheaper.  I also would recommend a nasal spray: Ok to continue your Flonase nasal spray.  You may also benefit from eye drops such as: Systane 1-2 driops each eye twice daily as needed  I also recommend Mucinex D Over the counter to help reduce sinus pressure and dry up this congestion.   HOME CARE:  You can use an over-the-counter saline nasal spray as needed Avoid areas where there is heavy dust, mites, or molds Stay indoors on windy days during the pollen season Keep windows closed in home, at least in bedroom; use air conditioner. Use high-efficiency house air filter Keep windows closed in car, turn AC on re-circulate Avoid playing out with dog during pollen season  GET HELP RIGHT AWAY IF:  If your symptoms do not improve within 10 days You become short of breath You develop yellow or green  discharge from your nose for over 3 days You have coughing fits  MAKE SURE YOU:  Understand these instructions Will watch your condition Will get help right away if you are not doing well or get worse  Thank you for choosing an e-visit. Your e-visit answers were reviewed by a board certified advanced clinical practitioner to complete your personal care plan. Depending upon the condition, your plan could have included both over the counter or prescription medications. Please review your pharmacy choice. Be sure that the pharmacy you have chosen is open so that you can pick up your prescription now.  If there is a problem you may message your provider in MyChart to have the prescription routed to another pharmacy. Your safety is important to [place]. If you have drug allergies check your prescription carefully.  For the next 24 hours, you can use MyChart to ask questions about today's visit, request a non-urgent call back, or ask for a work or school excuse from your e-visit provider. You will get an email in the next two days asking about your experience. I hope that your e-visit has been valuable and will speed your recovery.

## 2023-08-16 ENCOUNTER — Ambulatory Visit: Payer: Self-pay | Admitting: Obstetrics and Gynecology

## 2023-09-06 ENCOUNTER — Other Ambulatory Visit: Payer: Self-pay

## 2023-09-06 ENCOUNTER — Other Ambulatory Visit: Payer: Self-pay | Admitting: Obstetrics and Gynecology

## 2023-09-06 MED ORDER — FLULAVAL 0.5 ML IM SUSY
PREFILLED_SYRINGE | INTRAMUSCULAR | 0 refills | Status: DC
  Filled 2023-09-06: qty 0.5, 1d supply, fill #0

## 2023-09-07 ENCOUNTER — Other Ambulatory Visit: Payer: Self-pay

## 2023-09-07 MED ORDER — PANTOPRAZOLE SODIUM 20 MG PO TBEC
20.0000 mg | DELAYED_RELEASE_TABLET | Freq: Two times a day (BID) | ORAL | 3 refills | Status: DC
  Filled 2023-09-07: qty 60, 30d supply, fill #0
  Filled 2023-10-03: qty 60, 30d supply, fill #1
  Filled 2024-01-01: qty 60, 30d supply, fill #2
  Filled 2024-02-22: qty 60, 30d supply, fill #3

## 2023-09-08 ENCOUNTER — Other Ambulatory Visit: Payer: Self-pay | Admitting: Obstetrics and Gynecology

## 2023-09-08 ENCOUNTER — Other Ambulatory Visit: Payer: Self-pay

## 2023-09-08 DIAGNOSIS — J302 Other seasonal allergic rhinitis: Secondary | ICD-10-CM

## 2023-09-26 ENCOUNTER — Ambulatory Visit: Payer: Self-pay | Admitting: Obstetrics and Gynecology

## 2023-10-02 NOTE — Patient Instructions (Signed)
Preventive Care 21-35 Years Old, Female Preventive care refers to lifestyle choices and visits with your health care provider that can promote health and wellness. Preventive care visits are also called wellness exams. What can I expect for my preventive care visit? Counseling During your preventive care visit, your health care provider may ask about your: Medical history, including: Past medical problems. Family medical history. Pregnancy history. Current health, including: Menstrual cycle. Method of birth control. Emotional well-being. Home life and relationship well-being. Sexual activity and sexual health. Lifestyle, including: Alcohol, nicotine or tobacco, and drug use. Access to firearms. Diet, exercise, and sleep habits. Work and work environment. Sunscreen use. Safety issues such as seatbelt and bike helmet use. Physical exam Your health care provider may check your: Height and weight. These may be used to calculate your BMI (body mass index). BMI is a measurement that tells if you are at a healthy weight. Waist circumference. This measures the distance around your waistline. This measurement also tells if you are at a healthy weight and may help predict your risk of certain diseases, such as type 2 diabetes and high blood pressure. Heart rate and blood pressure. Body temperature. Skin for abnormal spots. What immunizations do I need?  Vaccines are usually given at various ages, according to a schedule. Your health care provider will recommend vaccines for you based on your age, medical history, and lifestyle or other factors, such as travel or where you work. What tests do I need? Screening Your health care provider may recommend screening tests for certain conditions. This may include: Pelvic exam and Pap test. Lipid and cholesterol levels. Diabetes screening. This is done by checking your blood sugar (glucose) after you have not eaten for a while (fasting). Hepatitis  B test. Hepatitis C test. HIV (human immunodeficiency virus) test. STI (sexually transmitted infection) testing, if you are at risk. BRCA-related cancer screening. This may be done if you have a family history of breast, ovarian, tubal, or peritoneal cancers. Talk with your health care provider about your test results, treatment options, and if necessary, the need for more tests. Follow these instructions at home: Eating and drinking  Eat a healthy diet that includes fresh fruits and vegetables, whole grains, lean protein, and low-fat dairy products. Take vitamin and mineral supplements as recommended by your health care provider. Do not drink alcohol if: Your health care provider tells you not to drink. You are pregnant, may be pregnant, or are planning to become pregnant. If you drink alcohol: Limit how much you have to 0-1 drink a day. Know how much alcohol is in your drink. In the [place] one drink equals one 12 oz bottle of beer (355 mL), one 5 oz glass of wine (148 mL), or one 1 oz glass of hard liquor (44 mL). Lifestyle Brush your teeth every morning and night with fluoride toothpaste. Floss one time each day. Exercise for at least 30 minutes 5 or more days each week. Do not use any products that contain nicotine or tobacco. These products include cigarettes, chewing tobacco, and vaping devices, such as e-cigarettes. If you need help quitting, ask your health care provider. Do not use drugs. If you are sexually active, practice safe sex. Use a condom or other form of protection to prevent STIs. If you do not wish to become pregnant, use a form of birth control. If you plan to become pregnant, see your health care provider for a prepregnancy visit. Find healthy ways to manage stress, such as: Meditation,   yoga, or listening to music. Journaling. Talking to a trusted person. Spending time with friends and family. Minimize exposure to UV radiation to reduce your risk of skin  cancer. Safety Always wear your seat belt while driving or riding in a vehicle. Do not drive: If you have been drinking alcohol. Do not ride with someone who has been drinking. If you have been using any mind-altering substances or drugs. While texting. When you are tired or distracted. Wear a helmet and other protective equipment during sports activities. If you have firearms in your house, make sure you follow all gun safety procedures. Seek help if you have been physically or sexually abused. What's next? Go to your health care provider once a year for an annual wellness visit. Ask your health care provider how often you should have your eyes and teeth checked. Stay up to date on all vaccines. This information is not intended to replace advice given to you by your health care provider. Make sure you discuss any questions you have with your health care provider. Document Revised: 04/28/2021 Document Reviewed: 04/28/2021 Elsevier Patient Education  2024 [employer] Breast Self-Awareness Breast self-awareness is knowing how your breasts look and feel. You need to: Check your breasts on a regular basis. Tell your doctor about any changes. Become familiar with the look and feel of your breasts. This can help you catch a breast problem while it is still small and can be treated. You should do breast self-exams even if you have breast implants. What you need: A mirror. A well-lit room. A pillow or other soft object. How to do a breast self-exam Follow these steps to do a breast self-exam: Look for changes  Take off all the clothes above your waist. Stand in front of a mirror in a room with good lighting. Put your hands down at your sides. Compare your breasts in the mirror. Look for any difference between them, such as: A difference in shape. A difference in size. Wrinkles, dips, and bumps in one breast and not the other. Look at each breast for changes in the skin, such  as: Redness. Scaly areas. Skin that has gotten thicker. Dimpling. Open sores (ulcers). Look for changes in your nipples, such as: Fluid coming out of a nipple. Fluid around a nipple. Bleeding. Dimpling. Redness. A nipple that looks pushed in (retracted), or that has changed position. Feel for changes Lie on your back. Feel each breast. To do this: Pick a breast to feel. Place a pillow under the shoulder closest to that breast. Put the arm closest to that breast behind your head. Feel the nipple area of that breast using the hand of your other arm. Feel the area with the pads of your three middle fingers by making small circles with your fingers. Use light, medium, and firm pressure. Continue the overlapping circles, moving downward over the breast. Keep making circles with your fingers. Stop when you feel your ribs. Start making circles with your fingers again, this time going upward until you reach your collarbone. Then, make circles outward across your breast and into your armpit area. Squeeze your nipple. Check for discharge and lumps. Repeat these steps to check your other breast. Sit or stand in the tub or shower. With soapy water on your skin, feel each breast the same way you did when you were lying down. Write down what you find Writing down what you find can help you remember what to tell your doctor. Write down: What is   normal for each breast. Any changes you find in each breast. These include: The kind of changes you find. A tender or painful breast. Any lump you find. Write down its size and where it is. When you last had your monthly period (menstrual cycle). General tips If you are breastfeeding, the best time to check your breasts is after you feed your baby or after you use a breast pump. If you get monthly bleeding, the best time to check your breasts is 5-7 days after your monthly cycle ends. With time, you will become comfortable with the self-exam. You will  also start to know if there are changes in your breasts. Contact a doctor if: You see a change in the shape or size of your breasts or nipples. You see a change in the skin of your breast or nipples, such as red or scaly skin. You have fluid coming from your nipples that is not normal. You find a new lump or thick area. You have breast pain. You have any concerns about your breast health. Summary Breast self-awareness includes looking for changes in your breasts and feeling for changes within your breasts. You should do breast self-awareness in front of a mirror in a well-lit room. If you get monthly periods (menstrual cycles), the best time to check your breasts is 5-7 days after your period ends. Tell your doctor about any changes you see in your breasts. Changes include changes in size, changes on the skin, painful or tender breasts, or fluid from your nipples that is not normal. This information is not intended to replace advice given to you by your health care provider. Make sure you discuss any questions you have with your health care provider. Document Revised: 04/07/2022 Document Reviewed: 09/02/2021 Elsevier Patient Education  2024 Elsevier Inc.  

## 2023-10-02 NOTE — Progress Notes (Unsigned)
GYNECOLOGY ANNUAL PHYSICAL EXAM PROGRESS NOTE  Subjective:    Samantha Blackwell is a 37 y.o. G59P2002 female who presents for an annual exam.  The patient {is/is not/has never been:13135} sexually active. The patient participates in regular exercise: {yes/no/not asked:9010}. Has the patient ever been transfused or tattooed?: {yes/no/not asked:9010}. The patient reports that there {is/is not:9024} domestic violence in her life.   The patient has the following complaints today:   Menstrual History: Menarche age:3 No LMP recorded. (Menstrual status: Oral contraceptives).     Gynecologic History:  Contraception: OCP (estrogen/progesterone) History of STI's:  Last Pap: 03/16/2021. Results were: normal. Denies h/o abnormal pap smears. Last mammogram: Not age appropriate      OB History  Gravida Para Term Preterm AB Living  2 2 2  0 0 2  SAB IAB Ectopic Multiple Live Births  0 0 0 0 2    # Outcome Date GA Lbr Len/2nd Weight Sex Type Anes PTL Lv  2 Term 08/29/18 [redacted]w[redacted]d  8 lb 3.9 oz (3.74 kg) F Vag-Vacuum EPI N LIV     Apgar1: 8  Apgar5: 9  1 Term 04/26/15 [redacted]w[redacted]d 03:01 / 00:51 7 lb 5.8 oz (3.34 kg) M Vag-Spont None  LIV     Apgar1: 9  Apgar5: 9    Past Medical History:  Diagnosis Date   Dysmenorrhea    GERD (gastroesophageal reflux disease)    History of kidney stones 05/2015   History of ovarian cyst     Past Surgical History:  Procedure Laterality Date   NO PAST SURGERIES      Family History  Problem Relation Age of Onset   Prostate cancer Father    Cancer Maternal Grandmother        Breast cancer(reoccurence in contralateral breast in aug 2018) , ovarian cancer (sept 2018)   Breast cancer Maternal Grandmother    Breast cancer Maternal Aunt    Bladder Cancer Neg Hx    Kidney cancer Neg Hx     Social History   Socioeconomic History   Marital status: Married    Spouse name: Not on file   Number of children: Not on file   Years of education: Not on file    Highest education level: Not on file  Occupational History   Not on file  Tobacco Use   Smoking status: Never   Smokeless tobacco: Never  Vaping Use   Vaping status: Never Used  Substance and Sexual Activity   Alcohol use: No    Alcohol/week: 0.0 standard drinks of alcohol   Drug use: No   Sexual activity: Yes    Birth control/protection: Pill  Other Topics Concern   Not on file  Social History Narrative   Not on file   Social Determinants of Health   Financial Resource Strain: Not on file  Food Insecurity: Not on file  Transportation Needs: Not on file  Physical Activity: Not on file  Stress: Not on file  Social Connections: Not on file  Intimate Partner Violence: Not on file    Current Outpatient Medications on File Prior to Visit  Medication Sig Dispense Refill   fluticasone (FLONASE) 50 MCG/ACT nasal spray Place 2 sprays into both nostrils daily. 16 g 6   influenza vac split trivalent PF (FLULAVAL) 0.5 ML injection Inject into the muscle. 0.5 mL 0   levocetirizine (XYZAL) 5 MG tablet Take 1 tablet (5 mg total) by mouth every evening. 30 tablet 0   Norethindrone-Ethinyl Estradiol-Fe Biphas (  LO LOESTRIN FE) 1 MG-10 MCG / 10 MCG tablet Take 1 tablet by mouth daily. 84 tablet 0   pantoprazole (PROTONIX) 20 MG tablet Take 1 tablet (20 mg total) by mouth 2 (two) times daily. 60 tablet 3   phentermine 30 MG capsule Take 1 capsule (30 mg total) by mouth every morning. 30 capsule 0   No current facility-administered medications on file prior to visit.    Allergies  Allergen Reactions   Penicillins Rash     Review of Systems Constitutional: negative for chills, fatigue, fevers and sweats Eyes: negative for irritation, redness and visual disturbance Ears, nose, mouth, throat, and face: negative for hearing loss, nasal congestion, snoring and tinnitus Respiratory: negative for asthma, cough, sputum Cardiovascular: negative for chest pain, dyspnea, exertional chest  pressure/discomfort, irregular heart beat, palpitations and syncope Gastrointestinal: negative for abdominal pain, change in bowel habits, nausea and vomiting Genitourinary: negative for abnormal menstrual periods, genital lesions, sexual problems and vaginal discharge, dysuria and urinary incontinence Integument/breast: negative for breast lump, breast tenderness and nipple discharge Hematologic/lymphatic: negative for bleeding and easy bruising Musculoskeletal:negative for back pain and muscle weakness Neurological: negative for dizziness, headaches, vertigo and weakness Endocrine: negative for diabetic symptoms including polydipsia, polyuria and skin dryness Allergic/Immunologic: negative for hay fever and urticaria      Objective:  There were no vitals taken for this visit. There is no height or weight on file to calculate BMI.    General Appearance:    Alert, cooperative, no distress, appears stated age  Head:    Normocephalic, without obvious abnormality, atraumatic  Eyes:    PERRL, conjunctiva/corneas clear, EOM's intact, both eyes  Ears:    Normal external ear canals, both ears  Nose:   Nares normal, septum midline, mucosa normal, no drainage or sinus tenderness  Throat:   Lips, mucosa, and tongue normal; teeth and gums normal  Neck:   Supple, symmetrical, trachea midline, no adenopathy; thyroid: no enlargement/tenderness/nodules; no carotid bruit or JVD  Back:     Symmetric, no curvature, ROM normal, no CVA tenderness  Lungs:     Clear to auscultation bilaterally, respirations unlabored  Chest Wall:    No tenderness or deformity   Heart:    Regular rate and rhythm, S1 and S2 normal, no murmur, rub or gallop  Breast Exam:    No tenderness, masses, or nipple abnormality  Abdomen:     Soft, non-tender, bowel sounds active all four quadrants, no masses, no organomegaly.    Genitalia:    Pelvic:external genitalia normal, vagina without lesions, discharge, or tenderness, rectovaginal  septum  normal. Cervix normal in appearance, no cervical motion tenderness, no adnexal masses or tenderness.  Uterus normal size, shape, mobile, regular contours, nontender.  Rectal:    Normal external sphincter.  No hemorrhoids appreciated. Internal exam not done.   Extremities:   Extremities normal, atraumatic, no cyanosis or edema  Pulses:   2+ and symmetric all extremities  Skin:   Skin color, texture, turgor normal, no rashes or lesions  Lymph nodes:   Cervical, supraclavicular, and axillary nodes normal  Neurologic:   CNII-XII intact, normal strength, sensation and reflexes throughout   .  Labs:  Lab Results  Component Value Date   WBC 11.2 (H) 08/30/2018   HGB 8.7 (L) 08/30/2018   HCT 26.3 (L) 08/30/2018   MCV 89.5 08/30/2018   PLT 225 08/30/2018    Lab Results  Component Value Date   CREATININE 0.58 06/07/2018   BUN 7  06/07/2018   NA 138 06/07/2018   K 3.4 (L) 06/07/2018   CL 107 06/07/2018   CO2 23 06/07/2018    Lab Results  Component Value Date   ALT 13 07/14/2015   AST 14 07/14/2015   ALKPHOS 91 07/14/2015   BILITOT 0.3 07/14/2015    No results found for: "TSH"   Assessment:   1. Well woman exam with routine gynecological exam   2. Obesity (BMI 30.0-34.9)   3. Screening cholesterol level   4. Screening for diabetes mellitus (DM)   5. Vitamin D deficiency   6. Need for hepatitis C screening test      Plan:  Blood tests: Pending. Breast self exam technique reviewed and patient encouraged to perform self-exam monthly. Contraception: OCP (estrogen/progesterone). Discussed healthy lifestyle modifications. Mammogram  Not age appropriate Pap smear  UTD . Flu vaccine: Follow up in 1 year for annual exam     Hildred Laser, MD [facility]

## 2023-10-03 ENCOUNTER — Ambulatory Visit (INDEPENDENT_AMBULATORY_CARE_PROVIDER_SITE_OTHER): Payer: 59 | Admitting: Obstetrics and Gynecology

## 2023-10-03 ENCOUNTER — Other Ambulatory Visit: Payer: Self-pay

## 2023-10-03 ENCOUNTER — Encounter: Payer: Self-pay | Admitting: Obstetrics and Gynecology

## 2023-10-03 VITALS — BP 109/71 | HR 71 | Resp 16 | Ht 64.0 in | Wt 147.6 lb

## 2023-10-03 DIAGNOSIS — Z131 Encounter for screening for diabetes mellitus: Secondary | ICD-10-CM | POA: Diagnosis not present

## 2023-10-03 DIAGNOSIS — Z124 Encounter for screening for malignant neoplasm of cervix: Secondary | ICD-10-CM

## 2023-10-03 DIAGNOSIS — E66811 Obesity, class 1: Secondary | ICD-10-CM

## 2023-10-03 DIAGNOSIS — J302 Other seasonal allergic rhinitis: Secondary | ICD-10-CM

## 2023-10-03 DIAGNOSIS — Z13220 Encounter for screening for lipoid disorders: Secondary | ICD-10-CM

## 2023-10-03 DIAGNOSIS — Z01419 Encounter for gynecological examination (general) (routine) without abnormal findings: Secondary | ICD-10-CM

## 2023-10-03 DIAGNOSIS — Z1159 Encounter for screening for other viral diseases: Secondary | ICD-10-CM

## 2023-10-03 DIAGNOSIS — E559 Vitamin D deficiency, unspecified: Secondary | ICD-10-CM

## 2023-10-03 MED ORDER — LO LOESTRIN FE 1 MG-10 MCG / 10 MCG PO TABS
1.0000 | ORAL_TABLET | Freq: Every day | ORAL | 3 refills | Status: DC
  Filled 2023-10-03 – 2023-10-26 (×2): qty 84, 84d supply, fill #0
  Filled 2024-01-15: qty 84, 84d supply, fill #1
  Filled 2024-04-08: qty 84, 84d supply, fill #2
  Filled 2024-07-01: qty 84, 84d supply, fill #3

## 2023-10-03 MED ORDER — LEVOCETIRIZINE DIHYDROCHLORIDE 5 MG PO TABS
5.0000 mg | ORAL_TABLET | Freq: Every evening | ORAL | 3 refills | Status: DC
  Filled 2023-10-03: qty 90, 90d supply, fill #0
  Filled 2024-02-22: qty 90, 90d supply, fill #1
  Filled 2024-04-23 – 2024-04-24 (×2): qty 90, 90d supply, fill #2

## 2023-10-04 LAB — HEMOGLOBIN A1C
Est. average glucose Bld gHb Est-mCnc: 100 mg/dL
Hgb A1c MFr Bld: 5.1 % (ref 4.8–5.6)

## 2023-10-04 LAB — TSH: TSH: 1.04 u[IU]/mL (ref 0.450–4.500)

## 2023-10-04 LAB — LIPID PANEL
Chol/HDL Ratio: 2.9 ratio (ref 0.0–4.4)
Cholesterol, Total: 162 mg/dL (ref 100–199)
HDL: 56 mg/dL (ref >39)
LDL Chol Calc (NIH): 93 mg/dL (ref 0–99)
Triglycerides: 69 mg/dL (ref 0–149)
VLDL Cholesterol Cal: 13 mg/dL (ref 5–40)

## 2023-10-04 LAB — COMPREHENSIVE METABOLIC PANEL
ALT: 14 [IU]/L (ref 0–32)
AST: 15 [IU]/L (ref 0–40)
Albumin: 4.3 g/dL (ref 3.9–4.9)
Alkaline Phosphatase: 64 [IU]/L (ref 44–121)
BUN/Creatinine Ratio: 13 (ref 9–23)
BUN: 11 mg/dL (ref 6–20)
Bilirubin Total: 0.4 mg/dL (ref 0.0–1.2)
CO2: 23 mmol/L (ref 20–29)
Calcium: 9.3 mg/dL (ref 8.7–10.2)
Chloride: 105 mmol/L (ref 96–106)
Creatinine, Ser: 0.85 mg/dL (ref 0.57–1.00)
Globulin, Total: 2.9 g/dL (ref 1.5–4.5)
Glucose: 78 mg/dL (ref 70–99)
Potassium: 4.6 mmol/L (ref 3.5–5.2)
Sodium: 140 mmol/L (ref 134–144)
Total Protein: 7.2 g/dL (ref 6.0–8.5)
eGFR: 92 mL/min/{1.73_m2} (ref >59)

## 2023-10-04 LAB — CBC
Hematocrit: 42.8 % (ref 34.0–46.6)
Hemoglobin: 13.4 g/dL (ref 11.1–15.9)
MCH: 29.7 pg (ref 26.6–33.0)
MCHC: 31.3 g/dL — ABNORMAL LOW (ref 31.5–35.7)
MCV: 95 fL (ref 79–97)
Platelets: 341 10*3/uL (ref 150–450)
RBC: 4.51 x10E6/uL (ref 3.77–5.28)
RDW: 11.9 % (ref 11.7–15.4)
WBC: 5.4 10*3/uL (ref 3.4–10.8)

## 2023-10-27 ENCOUNTER — Other Ambulatory Visit: Payer: Self-pay

## 2024-02-28 ENCOUNTER — Other Ambulatory Visit: Payer: Self-pay

## 2024-04-08 ENCOUNTER — Other Ambulatory Visit: Payer: Self-pay

## 2024-04-09 ENCOUNTER — Other Ambulatory Visit: Payer: Self-pay

## 2024-04-10 ENCOUNTER — Other Ambulatory Visit: Payer: Self-pay

## 2024-04-11 ENCOUNTER — Other Ambulatory Visit: Payer: Self-pay

## 2024-04-15 ENCOUNTER — Other Ambulatory Visit: Payer: Self-pay

## 2024-04-16 ENCOUNTER — Other Ambulatory Visit: Payer: Self-pay

## 2024-04-17 ENCOUNTER — Other Ambulatory Visit: Payer: Self-pay

## 2024-04-18 ENCOUNTER — Other Ambulatory Visit: Payer: Self-pay

## 2024-04-24 ENCOUNTER — Other Ambulatory Visit: Payer: Self-pay

## 2024-05-21 ENCOUNTER — Ambulatory Visit: Admitting: Obstetrics

## 2024-05-21 ENCOUNTER — Other Ambulatory Visit: Payer: Self-pay

## 2024-05-21 MED ORDER — PANTOPRAZOLE SODIUM 20 MG PO TBEC
20.0000 mg | DELAYED_RELEASE_TABLET | Freq: Two times a day (BID) | ORAL | 0 refills | Status: DC
  Filled 2024-05-21: qty 120, 60d supply, fill #0

## 2024-07-02 ENCOUNTER — Other Ambulatory Visit: Payer: Self-pay

## 2024-07-17 ENCOUNTER — Encounter: Payer: Self-pay | Admitting: General Practice

## 2024-07-18 ENCOUNTER — Encounter: Payer: Self-pay | Admitting: General Practice

## 2024-07-18 ENCOUNTER — Ambulatory Visit: Admitting: General Practice

## 2024-07-18 ENCOUNTER — Other Ambulatory Visit: Payer: Self-pay

## 2024-07-18 VITALS — BP 122/80 | HR 85 | Temp 98.3°F | Ht 63.5 in | Wt 139.1 lb

## 2024-07-18 DIAGNOSIS — Z6824 Body mass index (BMI) 24.0-24.9, adult: Secondary | ICD-10-CM

## 2024-07-18 DIAGNOSIS — J302 Other seasonal allergic rhinitis: Secondary | ICD-10-CM

## 2024-07-18 DIAGNOSIS — K219 Gastro-esophageal reflux disease without esophagitis: Secondary | ICD-10-CM

## 2024-07-18 DIAGNOSIS — Z7689 Persons encountering health services in other specified circumstances: Secondary | ICD-10-CM | POA: Insufficient documentation

## 2024-07-18 DIAGNOSIS — Z6823 Body mass index (BMI) 23.0-23.9, adult: Secondary | ICD-10-CM | POA: Insufficient documentation

## 2024-07-18 MED ORDER — LEVOCETIRIZINE DIHYDROCHLORIDE 5 MG PO TABS
5.0000 mg | ORAL_TABLET | Freq: Every evening | ORAL | 1 refills | Status: AC
  Filled 2024-07-18: qty 90, 90d supply, fill #0

## 2024-07-18 MED ORDER — PANTOPRAZOLE SODIUM 20 MG PO TBEC
20.0000 mg | DELAYED_RELEASE_TABLET | Freq: Two times a day (BID) | ORAL | 1 refills | Status: AC
  Filled 2024-07-18: qty 120, 60d supply, fill #0

## 2024-07-18 NOTE — Patient Instructions (Addendum)
 Refill sent for pantoprazole  and xyzal .   Follow up in 3 months.   It was a pleasure to meet you today! Please don't hesitate to contact me with any questions. Welcome to [facility]!

## 2024-07-18 NOTE — Assessment & Plan Note (Signed)
 Currently managed on Semaglutide 1mg  (compounded) once every two weeks.  She will update if she would like to switch to self pay option through the manufacturer.  Currently going to [employer].

## 2024-07-18 NOTE — Assessment & Plan Note (Signed)
 Controlled.  Continue Pantoprazole  20 mg BID. Refill sent.

## 2024-07-18 NOTE — Assessment & Plan Note (Signed)
 EMR reviewed briefly.

## 2024-07-18 NOTE — Assessment & Plan Note (Signed)
 Controlled.   Continue Xyzal  5 mg once daily. Refill sent.  Nasal Congestion/Ear Pressure/Sinus Pressure: Try using Flonase  (fluticasone ) nasal spray. Instill 1 spray in each nostril twice daily.

## 2024-07-18 NOTE — Progress Notes (Signed)
 New Patient Office Visit  Subjective    Patient ID: Samantha Blackwell, female    DOB: 06-01-1986  Age: 38 y.o. MRN: [id number]  CC:  Chief Complaint  Patient presents with   New Patient (Initial Visit)    Wants to establish care with a PCP.    HPI Samantha SALMONS is a 38 y.o. female presents to establish care.   Previous PCP: Archie Savers MD  GERD: diagnosed many years ago. Currently managed on Pantoprazole  20 mg once daily. She has been monitoring her trigger foods. She denies any concerns at this time.  Weight concern: She has been obese in the past and has been doing Semaglutide 1 mg once every two weeks. She is going through [employer], chartered). She has been doing this for four weeks ago. Prior to this she has been going to [employer] for the past year. She has been doing well. No side effects. She has seen an improvement with her acid reflux.   Outpatient Encounter Medications as of 07/18/2024  Medication Sig   fluticasone  (FLONASE ) 50 MCG/ACT nasal spray Place 2 sprays into both nostrils daily.   Norethindrone -Ethinyl Estradiol -Fe Biphas (LO LOESTRIN FE ) 1 MG-10 MCG / 10 MCG tablet Take 1 tablet by mouth daily.   [DISCONTINUED] levocetirizine (XYZAL ) 5 MG tablet Take 1 tablet (5 mg total) by mouth every evening.   [DISCONTINUED] pantoprazole  (PROTONIX ) 20 MG tablet Take 1 tablet (20 mg total) by mouth 2 (two) times daily.   levocetirizine (XYZAL ) 5 MG tablet Take 1 tablet (5 mg total) by mouth every evening.   pantoprazole  (PROTONIX ) 20 MG tablet Take 1 tablet (20 mg total) by mouth 2 (two) times daily.   [DISCONTINUED] influenza vac split trivalent PF (FLULAVAL ) 0.5 ML injection Inject into the muscle.   No facility-administered encounter medications on file as of 07/18/2024.    Past Medical History:  Diagnosis Date   Allergy    Season allergies   Dysmenorrhea    GERD (gastroesophageal reflux disease)    History of kidney stones 05/15/2015   History of ovarian cyst      Past Surgical History:  Procedure Laterality Date   NO PAST SURGERIES      Family History  Problem Relation Age of Onset   Prostate cancer Father    Cancer Maternal Grandmother        Breast cancer(reoccurence in contralateral breast in aug 2018) , ovarian cancer (sept 2018)   Breast cancer Maternal Grandmother    Breast cancer Maternal Aunt    Cancer Paternal Aunt    Bladder Cancer Neg Hx    Kidney cancer Neg Hx     Social History   Socioeconomic History   Marital status: Married    Spouse name: Not on file   Number of children: Not on file   Years of education: Not on file   Highest education level: Bachelor's degree (e.g., BA, AB, BS)  Occupational History   Not on file  Tobacco Use   Smoking status: Never   Smokeless tobacco: Never  Vaping Use   Vaping status: Never Used  Substance and Sexual Activity   Alcohol use: No    Alcohol/week: 0.0 standard drinks of alcohol   Drug use: No   Sexual activity: Yes    Birth control/protection: Pill  Other Topics Concern   Not on file  Social History Narrative   Not on file   Social Drivers of Health   Financial Resource Strain: Low Risk  (  07/16/2024)   Overall Financial Resource Strain (CARDIA)    Difficulty of Paying Living Expenses: Not very hard  Food Insecurity: No Food Insecurity (07/16/2024)   Hunger Vital Sign    Worried About Running Out of Food in the Last Year: Never true    Ran Out of Food in the Last Year: Never true  Transportation Needs: No Transportation Needs (07/16/2024)   PRAPARE - Administrator, Civil Service (Medical): No    Lack of Transportation (Non-Medical): No  Physical Activity: Sufficiently Active (07/16/2024)   Exercise Vital Sign    Days of Exercise per Week: 4 days    Minutes of Exercise per Session: 40 min  Stress: No Stress Concern Present (07/16/2024)   [employer] of Occupational Health - Occupational Stress Questionnaire    Feeling of Stress: Not at all  Social  Connections: Socially Integrated (07/16/2024)   Social Connection and Isolation Panel    Frequency of Communication with Friends and Family: More than three times a week    Frequency of Social Gatherings with Friends and Family: Once a week    Attends Religious Services: More than 4 times per year    Active Member of [employer] or Organizations: Yes    Attends Banker Meetings: 1 to 4 times per year    Marital Status: Married  Catering manager Violence: Not on file    Review of Systems  Constitutional:  Negative for chills and fever.  Respiratory:  Negative for shortness of breath.   Cardiovascular:  Negative for chest pain.  Gastrointestinal:  Negative for abdominal pain, constipation, diarrhea, heartburn, nausea and vomiting.  Genitourinary:  Negative for dysuria, frequency and urgency.  Neurological:  Negative for dizziness and headaches.  Endo/Heme/Allergies:  Negative for polydipsia.  Psychiatric/Behavioral:  Negative for depression and suicidal ideas. The patient is not nervous/anxious.         Objective    BP 122/80   Pulse 85   Temp 98.3 F (36.8 C) (Oral)   Ht 5' 3.5 (1.613 m)   Wt 139 lb 2 oz (63.1 kg)   LMP 07/04/2024   SpO2 96%   BMI 24.26 kg/m   Physical Exam Vitals and nursing note reviewed.  Constitutional:      Appearance: Normal appearance.  Cardiovascular:     Rate and Rhythm: Normal rate and regular rhythm.     Pulses: Normal pulses.     Heart sounds: Normal heart sounds.  Pulmonary:     Effort: Pulmonary effort is normal.     Breath sounds: Normal breath sounds.  Neurological:     Mental Status: She is alert and oriented to person, place, and time.  Psychiatric:        Mood and Affect: Mood normal.        Behavior: Behavior normal.        Thought Content: Thought content normal.        Judgment: Judgment normal.         Assessment & Plan:  Gastroesophageal reflux disease, unspecified whether esophagitis present Assessment &  Plan: Controlled.  Continue Pantoprazole  20 mg BID. Refill sent.  Orders: -     Pantoprazole  Sodium; Take 1 tablet (20 mg total) by mouth 2 (two) times daily.  Dispense: 120 tablet; Refill: 1  Establishing care with new doctor, encounter for Assessment & Plan: EMR reviewed briefly.    Seasonal allergic rhinitis, unspecified trigger Assessment & Plan: Controlled.   Continue Xyzal  5 mg once daily. Refill  sent.  Nasal Congestion/Ear Pressure/Sinus Pressure: Try using Flonase  (fluticasone ) nasal spray. Instill 1 spray in each nostril twice daily.   Orders: -     Levocetirizine Dihydrochloride ; Take 1 tablet (5 mg total) by mouth every evening.  Dispense: 90 tablet; Refill: 1  BMI 24.0-24.9, adult Assessment & Plan: Currently managed on Semaglutide 1mg  (compounded) once every two weeks.  She will update if she would like to switch to self pay option through the manufacturer.  Currently going to [employer].       Return in about 3 months (around 10/17/2024) for physical.   Carrol Aurora, NP

## 2024-07-25 ENCOUNTER — Other Ambulatory Visit: Payer: Self-pay

## 2024-07-25 MED ORDER — CEPHALEXIN 500 MG PO CAPS
500.0000 mg | ORAL_CAPSULE | Freq: Three times a day (TID) | ORAL | 0 refills | Status: DC
  Filled 2024-07-25: qty 21, 7d supply, fill #0

## 2024-07-25 MED ORDER — ACETAMINOPHEN-CODEINE 300-30 MG PO TABS
1.0000 | ORAL_TABLET | ORAL | 0 refills | Status: DC | PRN
  Filled 2024-07-25: qty 16, 2d supply, fill #0

## 2024-07-25 MED ORDER — METHYLPREDNISOLONE 4 MG PO TBPK
ORAL_TABLET | ORAL | 0 refills | Status: AC
  Filled 2024-07-25: qty 21, 6d supply, fill #0

## 2024-08-29 ENCOUNTER — Other Ambulatory Visit: Payer: Self-pay

## 2024-08-30 ENCOUNTER — Other Ambulatory Visit: Payer: Self-pay

## 2024-09-02 ENCOUNTER — Other Ambulatory Visit: Payer: Self-pay | Admitting: Obstetrics and Gynecology

## 2024-09-02 ENCOUNTER — Other Ambulatory Visit: Payer: Self-pay

## 2024-09-03 ENCOUNTER — Other Ambulatory Visit: Payer: Self-pay

## 2024-09-04 ENCOUNTER — Telehealth: Payer: Self-pay | Admitting: Obstetrics

## 2024-09-04 NOTE — Telephone Encounter (Signed)
 Patient has her annual scheduled on 10/23/24 with Dr Leigh is needing to get her bc pills lo lo estren se  called in to the pharmacy. Uses [facility] pharmacy.   CB# [phone]

## 2024-09-05 ENCOUNTER — Other Ambulatory Visit: Payer: Self-pay

## 2024-09-05 DIAGNOSIS — Z308 Encounter for other contraceptive management: Secondary | ICD-10-CM

## 2024-09-05 MED ORDER — LO LOESTRIN FE 1 MG-10 MCG / 10 MCG PO TABS
1.0000 | ORAL_TABLET | Freq: Every day | ORAL | 0 refills | Status: DC
  Filled 2024-09-05 – 2024-09-09 (×2): qty 84, 84d supply, fill #0

## 2024-09-05 NOTE — Progress Notes (Signed)
 Pt has annual 12/10.  One refill of OCPs authorized until patient can be seen.

## 2024-09-09 ENCOUNTER — Other Ambulatory Visit: Payer: Self-pay

## 2024-09-11 ENCOUNTER — Other Ambulatory Visit: Payer: Self-pay

## 2024-09-11 MED ORDER — FLUZONE 0.5 ML IM SUSY
0.5000 mL | PREFILLED_SYRINGE | Freq: Once | INTRAMUSCULAR | 0 refills | Status: AC
Start: 2024-09-11 — End: 2024-09-12
  Filled 2024-09-11: qty 0.5, 1d supply, fill #0

## 2024-10-09 NOTE — Progress Notes (Signed)
 GYNECOLOGY ANNUAL PHYSICAL EXAM NOTE  Subjective:    Samantha Blackwell is a 38 y.o. [id number] female who presents for an annual exam.  The patient is sexually active. The patient participates in regular exercise: yes. Has the patient ever been transfused or tattooed?: yes. The patient reports that there is not domestic violence in her life. The patient has completed the Gardasil vaccine: no  The patient has the following complaints today: She is concerned that she only has a period every 2-3  months on her OCPs with occasional breakthrough bleeding. She reports a lump on her labia that gets larger and smaller and is sometimes painful.   Menstrual History: Menarche age: 42 No LMP recorded. (Menstrual status: Oral contraceptives). Period Duration (Days): 4 Period Pattern: (!) Irregular Menstrual Flow: Moderate, Light Menstrual Control: Tampon Menstrual Control Change Freq (Hours): 2 Dysmenorrhea: None   Gynecologic History:  Contraception: OCP (estrogen/progesterone) History of STI's: Denies Last Pap: 03/16/2021. Results were: normal.   History of abnormal pap(s): Denies Last mammogram: NA  OB History  Gravida Para Term Preterm AB Living  2 2 2  0 0 2  SAB IAB Ectopic Multiple Live Births  0 0 0 0 2    # Outcome Date GA Lbr Len/2nd Weight Sex Type Anes PTL Lv  2 Term 08/29/18 [redacted]w[redacted]d  8 lb 3.9 oz (3.74 kg) F Vag-Vacuum EPI N LIV     Apgar1: 8  Apgar5: 9  1 Term 04/26/15 [redacted]w[redacted]d 03:01 / 00:51 7 lb 5.8 oz (3.34 kg) M Vag-Spont None  LIV     Apgar1: 9  Apgar5: 9    Past Medical History:  Diagnosis Date   Allergy    Season allergies   Dysmenorrhea    GERD (gastroesophageal reflux disease)    History of kidney stones 05/15/2015   History of ovarian cyst     Past Surgical History:  Procedure Laterality Date   NO PAST SURGERIES      Family History  Problem Relation Age of Onset   Prostate cancer Father    Cancer Maternal Grandmother        Breast cancer(reoccurence in  contralateral breast in aug 2018) , ovarian cancer (sept 2018)   Breast cancer Maternal Grandmother    Ovarian cancer Maternal Grandmother    Breast cancer Maternal Aunt    Cancer Paternal Aunt    Breast cancer Paternal Aunt    Bladder Cancer Neg Hx    Kidney cancer Neg Hx     Social History   Socioeconomic History   Marital status: Married    Spouse name: Not on file   Number of children: Not on file   Years of education: Not on file   Highest education level: Bachelor's degree (e.g., BA, AB, BS)  Occupational History   Not on file  Tobacco Use   Smoking status: Never   Smokeless tobacco: Never  Vaping Use   Vaping status: Never Used  Substance and Sexual Activity   Alcohol use: No    Alcohol/week: 0.0 standard drinks of alcohol   Drug use: No   Sexual activity: Yes    Birth control/protection: Pill  Other Topics Concern   Not on file  Social History Narrative   Not on file   Social Drivers of Health   Financial Resource Strain: Low Risk  (07/16/2024)   Overall Financial Resource Strain (CARDIA)    Difficulty of Paying Living Expenses: Not very hard  Food Insecurity: No Food Insecurity (07/16/2024)  Hunger Vital Sign    Worried About Running Out of Food in the Last Year: Never true    Ran Out of Food in the Last Year: Never true  Transportation Needs: No Transportation Needs (07/16/2024)   PRAPARE - Administrator, Civil Service (Medical): No    Lack of Transportation (Non-Medical): No  Physical Activity: Sufficiently Active (07/16/2024)   Exercise Vital Sign    Days of Exercise per Week: 4 days    Minutes of Exercise per Session: 40 min  Stress: No Stress Concern Present (07/16/2024)   [employer] of Occupational Health - Occupational Stress Questionnaire    Feeling of Stress: Not at all  Social Connections: Socially Integrated (07/16/2024)   Social Connection and Isolation Panel    Frequency of Communication with Friends and Family: More than three  times a week    Frequency of Social Gatherings with Friends and Family: Once a week    Attends Religious Services: More than 4 times per year    Active Member of [employer] or Organizations: Yes    Attends Banker Meetings: 1 to 4 times per year    Marital Status: Married  Catering Manager Violence: Not on file    Current Outpatient Medications on File Prior to Visit  Medication Sig Dispense Refill   fluticasone  (FLONASE ) 50 MCG/ACT nasal spray Place 2 sprays into both nostrils daily. 16 g 6   levocetirizine (XYZAL ) 5 MG tablet Take 1 tablet (5 mg total) by mouth every evening. 90 tablet 1   Norethindrone -Ethinyl Estradiol -Fe Biphas (LO LOESTRIN FE ) 1 MG-10 MCG / 10 MCG tablet Take 1 tablet by mouth daily. 84 tablet 0   pantoprazole  (PROTONIX ) 20 MG tablet Take 1 tablet (20 mg total) by mouth 2 (two) times daily. 120 tablet 1   acetaminophen -codeine  (TYLENOL  #3) 300-30 MG tablet Take 1-2 tablets by mouth every 4 (four) hours as needed. Do not exceed 11 in 24 hours. (Patient not taking: Reported on 10/23/2024) 16 tablet 0   No current facility-administered medications on file prior to visit.    Allergies  Allergen Reactions   Penicillins Rash     Review of Systems Constitutional: negative for chills, fatigue, fevers and sweats Eyes: negative for irritation, redness and visual disturbance Ears, nose, mouth, throat, and face: negative for hearing loss, nasal congestion, snoring and tinnitus Respiratory: negative for asthma, cough, sputum Cardiovascular: negative for chest pain, dyspnea, exertional chest pressure/discomfort, irregular heart beat, palpitations and syncope Gastrointestinal: negative for abdominal pain, change in bowel habits, nausea and vomiting Genitourinary: negative for abnormal menstrual periods, genital lesions, sexual problems and vaginal discharge, dysuria and urinary incontinence Integument/breast: negative for breast lump, breast tenderness and nipple  discharge Hematologic/lymphatic: negative for bleeding and easy bruising Musculoskeletal:negative for back pain and muscle weakness Neurological: negative for dizziness, headaches, vertigo and weakness Endocrine: negative for diabetic symptoms including polydipsia, polyuria and skin dryness Allergic/Immunologic: negative for hay fever and urticaria      Objective:  Blood pressure 109/67, pulse 80, weight 132 lb 14.4 oz (60.3 kg). Body mass index is 23.17 kg/m.    General Appearance:    Alert, cooperative, no distress, appears stated age  Head:    Normocephalic, without obvious abnormality, atraumatic  Eyes:    Conjunctiva/corneas clear, EOMs intact bilaterally  Ears:    Normal external ear canals bilaterally  Nose:   Nares normal  Throat:   Lips, mucosa, and tongue normal; teeth and gums normal  Neck:   Supple,  symmetrical, trachea midline, no adenopathy; thyroid : no enlargement/tenderness/nodules; no carotid bruit or JVD  Back:     ROM normal, no CVA tenderness  Lungs:     Clear to auscultation bilaterally, respirations unlabored  Chest Wall:    No tenderness or deformity   Heart:    Regular rate and rhythm, S1 and S2 normal, no appreciated murmur, rub or gallop  Breast Exam:    No tenderness, masses, or nipple abnormality; no skin retraction, dimpling or nipple discharge.  Abdomen:     Soft, non-tender, bowel sounds active all four quadrants, no masses, no organomegaly.    Genitalia:    Pelvic: external genitalia normal, vagina without lesions, discharge, or tenderness, rectovaginal septum  normal. Cervix normal in appearance, no cervical motion tenderness, no adnexal masses or tenderness.  Uterus normal size, shape, mobile, regular contours, nontender.  Rectal:    Normal external sphincter.  No hemorrhoids appreciated. Internal exam not done.   Extremities:   Extremities normal, atraumatic, no cyanosis or edema  Pulses:   2+ and symmetric all extremities  Skin:   Skin color, texture,  turgor normal, no rashes or lesions  Lymph nodes:   Cervical, supraclavicular, and axillary nodes normal  Neurologic:   CNII-XII grossly intact, normal strength, sensation and reflexes throughout   .  Labs:  Lab Results  Component Value Date   WBC 5.4 10/03/2023   HGB 13.4 10/03/2023   HCT 42.8 10/03/2023   MCV 95 10/03/2023   PLT 341 10/03/2023    Lab Results  Component Value Date   CREATININE 0.85 10/03/2023   BUN 11 10/03/2023   NA 140 10/03/2023   K 4.6 10/03/2023   CL 105 10/03/2023   CO2 23 10/03/2023    Lab Results  Component Value Date   ALT 14 10/03/2023   AST 15 10/03/2023   ALKPHOS 64 10/03/2023   BILITOT 0.4 10/03/2023    Lab Results  Component Value Date   TSH 1.040 10/03/2023     Assessment:   1. Well woman exam with routine gynecological exam   2. Cervical cancer screening   3. Epidermoid cyst of vulva      Plan:   Samantha Blackwell is a 38 y.o. [phone] female here today for her annual exam, doing well.  Pap: due 03/2026 Mammogram: start at 38yo Labs: PCP PHQ-2 = 0 Contraception: OCPs, refilled today; discussed her Q2-52mo periods and some BTB, prefers to cont current formulation and watch and wait.  Vaccines: Gardasil declines; already had Flu vaccine Healthy lifestyle modifications discussed: multivitamin, diet, exercise, sunscreen. Emphasized importance of regular physical activity.  Folic Acid recommendation reviewed.  All questions answered to patient's satisfaction.   R perineal/buttock with inflamed inclusion cyst, subjectively improved from prior after home drainage and sitz baths.  -Rx for Keflex  x 1 week. Prn Diflucan  also sent -Discussed etiology and after inflammation has subsided, expectant management; reviewed excision if wanting gone, can contact clinic if desires.    Follow up 1 yr for annual, sooner prn.    Estil Mangle, DO [facility]

## 2024-10-17 ENCOUNTER — Encounter: Admitting: General Practice

## 2024-10-23 ENCOUNTER — Other Ambulatory Visit: Payer: Self-pay

## 2024-10-23 ENCOUNTER — Encounter: Payer: Self-pay | Admitting: Obstetrics

## 2024-10-23 ENCOUNTER — Ambulatory Visit: Admitting: Obstetrics

## 2024-10-23 VITALS — BP 109/67 | HR 80 | Wt 132.9 lb

## 2024-10-23 DIAGNOSIS — Z01419 Encounter for gynecological examination (general) (routine) without abnormal findings: Secondary | ICD-10-CM

## 2024-10-23 DIAGNOSIS — N907 Vulvar cyst: Secondary | ICD-10-CM

## 2024-10-23 DIAGNOSIS — Z01411 Encounter for gynecological examination (general) (routine) with abnormal findings: Secondary | ICD-10-CM | POA: Diagnosis not present

## 2024-10-23 DIAGNOSIS — Z124 Encounter for screening for malignant neoplasm of cervix: Secondary | ICD-10-CM

## 2024-10-23 DIAGNOSIS — Z1331 Encounter for screening for depression: Secondary | ICD-10-CM | POA: Diagnosis not present

## 2024-10-23 MED ORDER — FLUCONAZOLE 150 MG PO TABS
150.0000 mg | ORAL_TABLET | Freq: Once | ORAL | 0 refills | Status: DC | PRN
  Filled 2024-10-23: qty 1, 1d supply, fill #0

## 2024-10-23 MED ORDER — CEPHALEXIN 500 MG PO CAPS
500.0000 mg | ORAL_CAPSULE | Freq: Two times a day (BID) | ORAL | 0 refills | Status: DC
  Filled 2024-10-23: qty 14, 7d supply, fill #0

## 2024-11-03 ENCOUNTER — Other Ambulatory Visit: Payer: Self-pay | Admitting: Obstetrics

## 2024-11-03 DIAGNOSIS — Z308 Encounter for other contraceptive management: Secondary | ICD-10-CM

## 2024-11-04 ENCOUNTER — Other Ambulatory Visit: Payer: Self-pay

## 2024-11-04 MED ORDER — LO LOESTRIN FE 1 MG-10 MCG / 10 MCG PO TABS
1.0000 | ORAL_TABLET | Freq: Every day | ORAL | 3 refills | Status: AC
  Filled 2024-11-04 – 2024-12-02 (×3): qty 84, 84d supply, fill #0

## 2024-11-05 ENCOUNTER — Encounter: Payer: Self-pay | Admitting: General Practice

## 2024-11-05 ENCOUNTER — Ambulatory Visit (INDEPENDENT_AMBULATORY_CARE_PROVIDER_SITE_OTHER): Admitting: General Practice

## 2024-11-05 ENCOUNTER — Other Ambulatory Visit: Payer: Self-pay

## 2024-11-05 ENCOUNTER — Ambulatory Visit: Payer: Self-pay | Admitting: General Practice

## 2024-11-05 VITALS — BP 116/80 | HR 82 | Temp 98.7°F | Ht 63.5 in | Wt 134.8 lb

## 2024-11-05 DIAGNOSIS — J302 Other seasonal allergic rhinitis: Secondary | ICD-10-CM

## 2024-11-05 DIAGNOSIS — E559 Vitamin D deficiency, unspecified: Secondary | ICD-10-CM

## 2024-11-05 DIAGNOSIS — Z6823 Body mass index (BMI) 23.0-23.9, adult: Secondary | ICD-10-CM | POA: Diagnosis not present

## 2024-11-05 DIAGNOSIS — Z0000 Encounter for general adult medical examination without abnormal findings: Secondary | ICD-10-CM | POA: Diagnosis not present

## 2024-11-05 DIAGNOSIS — K219 Gastro-esophageal reflux disease without esophagitis: Secondary | ICD-10-CM | POA: Diagnosis not present

## 2024-11-05 LAB — CBC
HCT: 38.4 % (ref 36.0–46.0)
Hemoglobin: 13 g/dL (ref 12.0–15.0)
MCHC: 33.8 g/dL (ref 30.0–36.0)
MCV: 91 fl (ref 78.0–100.0)
Platelets: 283 K/uL (ref 150.0–400.0)
RBC: 4.22 Mil/uL (ref 3.87–5.11)
RDW: 13 % (ref 11.5–15.5)
WBC: 5.4 K/uL (ref 4.0–10.5)

## 2024-11-05 LAB — COMPREHENSIVE METABOLIC PANEL WITH GFR
ALT: 12 U/L (ref 3–35)
AST: 14 U/L (ref 5–37)
Albumin: 4.1 g/dL (ref 3.5–5.2)
Alkaline Phosphatase: 42 U/L (ref 39–117)
BUN: 8 mg/dL (ref 6–23)
CO2: 25 meq/L (ref 19–32)
Calcium: 8.6 mg/dL (ref 8.4–10.5)
Chloride: 106 meq/L (ref 96–112)
Creatinine, Ser: 0.62 mg/dL (ref 0.40–1.20)
GFR: 114.21 mL/min
Glucose, Bld: 85 mg/dL (ref 70–99)
Potassium: 4.2 meq/L (ref 3.5–5.1)
Sodium: 138 meq/L (ref 135–145)
Total Bilirubin: 0.4 mg/dL (ref 0.2–1.2)
Total Protein: 6.8 g/dL (ref 6.0–8.3)

## 2024-11-05 LAB — LIPID PANEL
Cholesterol: 156 mg/dL (ref 28–200)
HDL: 63.8 mg/dL
LDL Cholesterol: 83 mg/dL (ref 10–99)
NonHDL: 92.23
Total CHOL/HDL Ratio: 2
Triglycerides: 46 mg/dL (ref 10.0–149.0)
VLDL: 9.2 mg/dL (ref 0.0–40.0)

## 2024-11-05 LAB — TSH: TSH: 0.95 u[IU]/mL (ref 0.35–5.50)

## 2024-11-05 LAB — VITAMIN D 25 HYDROXY (VIT D DEFICIENCY, FRACTURES): VITD: 27.09 ng/mL — ABNORMAL LOW (ref 30.00–100.00)

## 2024-11-05 LAB — HEMOGLOBIN A1C: Hgb A1c MFr Bld: 5.1 % (ref 4.6–6.5)

## 2024-11-05 MED ORDER — VITAMIN D (ERGOCALCIFEROL) 1.25 MG (50000 UNIT) PO CAPS
50000.0000 [IU] | ORAL_CAPSULE | ORAL | 0 refills | Status: AC
  Filled 2024-11-05: qty 12, 84d supply, fill #0

## 2024-11-05 MED ORDER — SEMAGLUTIDE-WEIGHT MANAGEMENT 1 MG/0.5ML ~~LOC~~ SOAJ: 1.0000 mg | SUBCUTANEOUS | Status: AC

## 2024-11-05 NOTE — Assessment & Plan Note (Signed)
 Controlled.  Continue Xyzal  5 mg once daily.

## 2024-11-05 NOTE — Progress Notes (Addendum)
 "  Established Patient Office Visit  Subjective   Patient ID: Samantha Blackwell, female    DOB: Jul 01, 1986  Age: 38 y.o. MRN: [id number]  Chief Complaint  Patient presents with   Annual Exam    HPI  Samantha Blackwell is a 38 year old female with past medical history of seasonal allergic rhinitis, acid reflux, vitamin d  deficiency presents today for complete physical and follow up of chronic conditions.  Immunizations: -Tetanus: Completed in 2019 -Influenza: Completed this season. -HPV: due/ declines  Diet: Fair diet.  Exercise: regular exercise.  Eye exam: Completed several years ago.  Dental exam: Completes semi-annually    Pap Smear: Completed in 2022   Patient Active Problem List   Diagnosis Date Noted   Encounter for screening and preventative care 11/05/2024   Seasonal allergic rhinitis 07/18/2024   BMI 23.0-23.9, adult 07/18/2024   Vitamin D  deficiency 07/16/2015   Acid reflux 12/10/2014   Past Medical History:  Diagnosis Date   Allergy    Season allergies   Dysmenorrhea    GERD (gastroesophageal reflux disease)    History of kidney stones 05/15/2015   History of ovarian cyst    Past Surgical History:  Procedure Laterality Date   NO PAST SURGERIES     Allergies[1]       11/05/2024    8:21 AM 10/23/2024    8:25 AM 07/18/2024    9:09 AM  Depression screen PHQ 2/9  Decreased Interest 0 0 0  Down, Depressed, Hopeless 0 0 0  PHQ - 2 Score 0 0 0  Altered sleeping 0  0  Tired, decreased energy 0  0  Change in appetite 0  0  Feeling bad or failure about yourself  0  0  Trouble concentrating 0  0  Moving slowly or fidgety/restless 0  0  Suicidal thoughts 0  0  PHQ-9 Score 0  0   Difficult doing work/chores Not difficult at all       Data saved with a previous flowsheet row definition       11/05/2024    8:22 AM 07/18/2024    9:10 AM  GAD 7 : Generalized Anxiety Score  Nervous, Anxious, on Edge 0 0  Control/stop worrying 0 0  Worry too much -  different things 0 0  Trouble relaxing 0 0  Restless 0 0  Easily annoyed or irritable 0 0  Afraid - awful might happen 0 0  Total GAD 7 Score 0 0  Anxiety Difficulty Not difficult at all       Review of Systems  Constitutional:  Negative for chills, fever, malaise/fatigue and weight loss.  HENT:  Negative for congestion, ear discharge, ear pain, hearing loss, nosebleeds, sinus pain, sore throat and tinnitus.   Eyes:  Negative for blurred vision, double vision, pain, discharge and redness.  Respiratory:  Negative for cough, shortness of breath, wheezing and stridor.   Cardiovascular:  Negative for chest pain, palpitations and leg swelling.  Gastrointestinal:  Negative for abdominal pain, constipation, diarrhea, heartburn, nausea and vomiting.  Genitourinary:  Negative for dysuria, frequency and urgency.  Musculoskeletal:  Negative for myalgias.  Skin:  Negative for rash.  Neurological:  Negative for dizziness, tingling, seizures, weakness and headaches.  Endo/Heme/Allergies:  Negative for polydipsia.  Psychiatric/Behavioral:  Negative for depression, substance abuse and suicidal ideas. The patient is not nervous/anxious.       Objective:     BP 116/80   Pulse 82   Temp 98.7  F (37.1 C) (Temporal)   Ht 5' 3.5 (1.613 m)   Wt 134 lb 12.8 oz (61.1 kg)   SpO2 98%   BMI 23.50 kg/m  BP Readings from Last 3 Encounters:  11/05/24 116/80  10/23/24 109/67  07/18/24 122/80   Wt Readings from Last 3 Encounters:  11/05/24 134 lb 12.8 oz (61.1 kg)  10/23/24 132 lb 14.4 oz (60.3 kg)  07/18/24 139 lb 2 oz (63.1 kg)      Physical Exam Vitals and nursing note reviewed.  Constitutional:      Appearance: Normal appearance.  HENT:     Head: Normocephalic and atraumatic.     Right Ear: Tympanic membrane, ear canal and external ear normal.     Left Ear: Tympanic membrane, ear canal and external ear normal.     Nose: Nose normal.     Mouth/Throat:     Mouth: Mucous membranes are  moist.     Pharynx: Oropharynx is clear.  Eyes:     Conjunctiva/sclera: Conjunctivae normal.     Pupils: Pupils are equal, round, and reactive to light.  Cardiovascular:     Rate and Rhythm: Normal rate and regular rhythm.     Pulses: Normal pulses.     Heart sounds: Normal heart sounds.  Pulmonary:     Effort: Pulmonary effort is normal.     Breath sounds: Normal breath sounds.  Abdominal:     General: Abdomen is flat. Bowel sounds are normal.     Palpations: Abdomen is soft.  Musculoskeletal:        General: Normal range of motion.     Cervical back: Normal range of motion.  Skin:    General: Skin is warm and dry.     Capillary Refill: Capillary refill takes less than 2 seconds.  Neurological:     General: No focal deficit present.     Mental Status: She is alert and oriented to person, place, and time. Mental status is at baseline.  Psychiatric:        Mood and Affect: Mood normal.        Behavior: Behavior normal.        Thought Content: Thought content normal.        Judgment: Judgment normal.      No results found for any visits on 11/05/24.     The ASCVD Risk score (Arnett DK, et al., 2019) failed to calculate for the following reasons:   The 2019 ASCVD risk score is only valid for ages 45 to 1    Assessment & Plan:  Encounter for screening and preventative care Assessment & Plan: Immunizations UTD. Pap smear UTD.  Discussed the importance of a healthy diet and regular exercise in order for weight loss, and to reduce the risk of further co-morbidity.  Exam stable. Labs pending.  Follow up in 1 year for repeat physical.   Orders: -     CBC -     Comprehensive metabolic panel with GFR -     Lipid panel -     TSH -     Hemoglobin A1c  Gastroesophageal reflux disease, unspecified whether esophagitis present Assessment & Plan: Improved with diet and avoid triggers . Using pantoprazole  as needed.   Vitamin D  deficiency Assessment & Plan: Vitamin  d level pending.  Orders: -     VITAMIN D  25 Hydroxy (Vit-D Deficiency, Fractures)  Seasonal allergic rhinitis, unspecified trigger Assessment & Plan: Controlled.  Continue Xyzal  5 mg once daily.  BMI 23.0-23.9, adult Assessment & Plan: Controlled.  Currently managed on Semaglutide  1 mg (compounded) once every 2-3 weeks. Currently going to [employer] [facility].  Orders: -     Semaglutide -Weight Management; Inject 1 mg into the skin every 14 (fourteen) days. Patient getting from [facility]. Compounded with Vitamin B12     Return in about 1 year (around 11/05/2025) for physical and fasting labs.SABRA Carrol Aurora, NP     [1]  Allergies Allergen Reactions   Penicillins Rash   "

## 2024-11-05 NOTE — Addendum Note (Signed)
 Addended by: TENNIE RAISIN B on: 11/05/2024 08:46 AM   Modules accepted: Orders

## 2024-11-05 NOTE — Assessment & Plan Note (Signed)
Vitamin d level pending

## 2024-11-05 NOTE — Assessment & Plan Note (Signed)
 Immunizations UTD. Pap smear UTD.  Discussed the importance of a healthy diet and regular exercise in order for weight loss, and to reduce the risk of further co-morbidity.  Exam stable. Labs pending.  Follow up in 1 year for repeat physical.

## 2024-11-05 NOTE — Assessment & Plan Note (Signed)
 Improved with diet and avoid triggers . Using pantoprazole  as needed.

## 2024-11-05 NOTE — Assessment & Plan Note (Signed)
 Controlled.  Currently managed on Semaglutide  1 mg (compounded) once every 2-3 weeks. Currently going to [employer].

## 2024-11-05 NOTE — Patient Instructions (Signed)
 Stop by the lab prior to leaving today. I will notify you of your results once [email or web address]   Let me know when you need refills.   Follow up in one year for physical.   It was a pleasure to see you today!

## 2024-11-15 ENCOUNTER — Other Ambulatory Visit: Payer: Self-pay

## 2024-11-15 MED ORDER — CEPHALEXIN 500 MG PO CAPS
500.0000 mg | ORAL_CAPSULE | Freq: Three times a day (TID) | ORAL | 0 refills | Status: AC
  Filled 2024-11-15: qty 21, 7d supply, fill #0

## 2024-12-02 ENCOUNTER — Other Ambulatory Visit: Payer: Self-pay

## 2024-12-03 ENCOUNTER — Other Ambulatory Visit: Payer: Self-pay

## 2025-11-04 ENCOUNTER — Other Ambulatory Visit

## 2025-11-11 ENCOUNTER — Encounter: Admitting: General Practice

## 2025-11-17 ENCOUNTER — Encounter: Admitting: General Practice
